# Patient Record
Sex: Male | Born: 1935 | Race: White | Hispanic: No | Smoking: Former smoker
Health system: Southern US, Community
[De-identification: ages and names within clinical notes are randomized; demographics above are authoritative.]

## PROBLEM LIST (undated history)

## (undated) DIAGNOSIS — Z950 Presence of cardiac pacemaker: Secondary | ICD-10-CM

## (undated) DIAGNOSIS — M1990 Unspecified osteoarthritis, unspecified site: Secondary | ICD-10-CM

## (undated) DIAGNOSIS — D649 Anemia, unspecified: Secondary | ICD-10-CM

## (undated) DIAGNOSIS — I2510 Atherosclerotic heart disease of native coronary artery without angina pectoris: Secondary | ICD-10-CM

## (undated) DIAGNOSIS — I219 Acute myocardial infarction, unspecified: Secondary | ICD-10-CM

## (undated) DIAGNOSIS — R0602 Shortness of breath: Secondary | ICD-10-CM

## (undated) DIAGNOSIS — Z95810 Presence of automatic (implantable) cardiac defibrillator: Secondary | ICD-10-CM

## (undated) DIAGNOSIS — I509 Heart failure, unspecified: Secondary | ICD-10-CM

## (undated) DIAGNOSIS — F419 Anxiety disorder, unspecified: Secondary | ICD-10-CM

## (undated) DIAGNOSIS — I739 Peripheral vascular disease, unspecified: Secondary | ICD-10-CM

## (undated) HISTORY — PX: SHOULDER SURGERY: SHX246

## (undated) HISTORY — PX: TOTAL HIP ARTHROPLASTY: SHX124

## (undated) HISTORY — PX: HERNIA REPAIR: SHX51

## (undated) HISTORY — PX: INSERT / REPLACE / REMOVE PACEMAKER: SUR710

---

## 2002-09-14 ENCOUNTER — Encounter: Admission: RE | Admit: 2002-09-14 | Discharge: 2002-09-14 | Payer: Self-pay | Admitting: Internal Medicine

## 2002-09-14 ENCOUNTER — Encounter: Payer: Self-pay | Admitting: Internal Medicine

## 2003-02-14 ENCOUNTER — Encounter: Payer: Self-pay | Admitting: Neurology

## 2003-02-14 ENCOUNTER — Encounter: Admission: RE | Admit: 2003-02-14 | Discharge: 2003-02-14 | Payer: Self-pay | Admitting: Neurology

## 2004-06-11 ENCOUNTER — Ambulatory Visit: Payer: Self-pay | Admitting: Internal Medicine

## 2004-07-27 HISTORY — PX: LEG AMPUTATION THROUGH KNEE: SHX696

## 2004-08-11 ENCOUNTER — Ambulatory Visit: Payer: Self-pay | Admitting: Internal Medicine

## 2005-05-13 ENCOUNTER — Ambulatory Visit: Payer: Self-pay | Admitting: Internal Medicine

## 2005-06-16 ENCOUNTER — Ambulatory Visit: Payer: Self-pay

## 2005-07-29 ENCOUNTER — Ambulatory Visit: Payer: Self-pay | Admitting: Internal Medicine

## 2005-08-11 ENCOUNTER — Ambulatory Visit: Payer: Self-pay | Admitting: Cardiology

## 2005-10-29 ENCOUNTER — Ambulatory Visit: Payer: Self-pay | Admitting: Internal Medicine

## 2006-02-02 ENCOUNTER — Ambulatory Visit: Payer: Self-pay | Admitting: Internal Medicine

## 2006-04-12 ENCOUNTER — Ambulatory Visit: Payer: Self-pay | Admitting: Internal Medicine

## 2006-05-20 ENCOUNTER — Ambulatory Visit: Payer: Self-pay | Admitting: Internal Medicine

## 2006-09-07 ENCOUNTER — Ambulatory Visit: Payer: Self-pay | Admitting: Internal Medicine

## 2006-09-07 LAB — CONVERTED CEMR LAB
ALT: 33 units/L (ref 0–40)
AST: 31 units/L (ref 0–37)
Albumin: 3.6 g/dL (ref 3.5–5.2)
Bilirubin, Direct: 0.2 mg/dL (ref 0.0–0.3)
Calcium: 9.6 mg/dL (ref 8.4–10.5)
Chloride: 97 meq/L (ref 96–112)
Creatinine, Ser: 0.6 mg/dL (ref 0.4–1.5)
Creatinine,U: 51 mg/dL
Eosinophils Relative: 1.8 % (ref 0.0–5.0)
Glucose, Bld: 125 mg/dL — ABNORMAL HIGH (ref 70–99)
HCT: 43 % (ref 39.0–52.0)
Neutrophils Relative %: 58 % (ref 43.0–77.0)
PSA: 1.4 ng/mL (ref 0.10–4.00)
RBC: 4.72 M/uL (ref 4.22–5.81)
RDW: 12.5 % (ref 11.5–14.6)
Sodium: 138 meq/L (ref 135–145)
TSH: 2.46 microintl units/mL (ref 0.35–5.50)
Total Bilirubin: 0.9 mg/dL (ref 0.3–1.2)
Total CHOL/HDL Ratio: 4.8
Triglycerides: 307 mg/dL (ref 0–149)
VLDL: 61 mg/dL — ABNORMAL HIGH (ref 0–40)
WBC: 6.4 10*3/uL (ref 4.5–10.5)

## 2006-09-13 ENCOUNTER — Encounter: Payer: Self-pay | Admitting: Internal Medicine

## 2006-09-13 LAB — CONVERTED CEMR LAB: PSA: 1.4 ng/mL

## 2006-12-13 ENCOUNTER — Ambulatory Visit: Payer: Self-pay | Admitting: Internal Medicine

## 2007-02-26 ENCOUNTER — Encounter: Payer: Self-pay | Admitting: Internal Medicine

## 2007-02-26 DIAGNOSIS — N400 Enlarged prostate without lower urinary tract symptoms: Secondary | ICD-10-CM | POA: Insufficient documentation

## 2007-02-26 DIAGNOSIS — F329 Major depressive disorder, single episode, unspecified: Secondary | ICD-10-CM | POA: Insufficient documentation

## 2007-02-26 DIAGNOSIS — K219 Gastro-esophageal reflux disease without esophagitis: Secondary | ICD-10-CM

## 2007-02-26 DIAGNOSIS — I10 Essential (primary) hypertension: Secondary | ICD-10-CM | POA: Insufficient documentation

## 2007-02-26 DIAGNOSIS — E119 Type 2 diabetes mellitus without complications: Secondary | ICD-10-CM

## 2007-02-26 DIAGNOSIS — R160 Hepatomegaly, not elsewhere classified: Secondary | ICD-10-CM

## 2007-02-26 DIAGNOSIS — F411 Generalized anxiety disorder: Secondary | ICD-10-CM | POA: Insufficient documentation

## 2007-02-26 DIAGNOSIS — E785 Hyperlipidemia, unspecified: Secondary | ICD-10-CM | POA: Insufficient documentation

## 2007-02-26 DIAGNOSIS — E669 Obesity, unspecified: Secondary | ICD-10-CM | POA: Insufficient documentation

## 2007-03-15 ENCOUNTER — Ambulatory Visit: Payer: Self-pay | Admitting: Internal Medicine

## 2007-03-15 LAB — CONVERTED CEMR LAB
BUN: 15 mg/dL (ref 6–23)
CO2: 30 meq/L (ref 19–32)
Calcium: 9.4 mg/dL (ref 8.4–10.5)
Cholesterol: 169 mg/dL (ref 0–200)
Creatinine, Ser: 0.6 mg/dL (ref 0.4–1.5)
GFR calc Af Amer: 168 mL/min
Glucose, Bld: 133 mg/dL — ABNORMAL HIGH (ref 70–99)
Total CHOL/HDL Ratio: 4.6

## 2007-06-13 ENCOUNTER — Telehealth: Payer: Self-pay | Admitting: Internal Medicine

## 2007-06-14 ENCOUNTER — Ambulatory Visit: Payer: Self-pay | Admitting: Internal Medicine

## 2007-06-14 DIAGNOSIS — I739 Peripheral vascular disease, unspecified: Secondary | ICD-10-CM

## 2007-06-14 DIAGNOSIS — N318 Other neuromuscular dysfunction of bladder: Secondary | ICD-10-CM | POA: Insufficient documentation

## 2007-06-14 DIAGNOSIS — K5730 Diverticulosis of large intestine without perforation or abscess without bleeding: Secondary | ICD-10-CM | POA: Insufficient documentation

## 2007-06-14 DIAGNOSIS — Z87442 Personal history of urinary calculi: Secondary | ICD-10-CM | POA: Insufficient documentation

## 2007-06-14 LAB — CONVERTED CEMR LAB
Chloride: 103 meq/L (ref 96–112)
Cholesterol: 147 mg/dL (ref 0–200)
GFR calc Af Amer: 140 mL/min
GFR calc non Af Amer: 116 mL/min
Glucose, Bld: 66 mg/dL — ABNORMAL LOW (ref 70–99)
HDL: 46 mg/dL (ref >39.0)
Hgb A1c MFr Bld: 6.5 % — ABNORMAL HIGH (ref 4.6–6.0)
LDL Cholesterol: 67 mg/dL (ref 0–99)
Potassium: 5.2 meq/L — ABNORMAL HIGH (ref 3.5–5.1)
Sodium: 138 meq/L (ref 135–145)
Total CHOL/HDL Ratio: 3.2
Triglycerides: 172 mg/dL — ABNORMAL HIGH (ref 0–149)
VLDL: 34 mg/dL (ref 0–40)

## 2007-06-20 ENCOUNTER — Telehealth (INDEPENDENT_AMBULATORY_CARE_PROVIDER_SITE_OTHER): Payer: Self-pay | Admitting: *Deleted

## 2007-08-10 ENCOUNTER — Encounter: Payer: Self-pay | Admitting: Internal Medicine

## 2007-09-13 ENCOUNTER — Ambulatory Visit: Payer: Self-pay | Admitting: Internal Medicine

## 2007-09-13 DIAGNOSIS — R5381 Other malaise: Secondary | ICD-10-CM | POA: Insufficient documentation

## 2007-09-13 DIAGNOSIS — R5383 Other fatigue: Secondary | ICD-10-CM

## 2007-09-14 LAB — CONVERTED CEMR LAB
ALT: 14 units/L (ref 0–53)
Alkaline Phosphatase: 69 units/L (ref 39–117)
Basophils Relative: 1.2 % — ABNORMAL HIGH (ref 0.0–1.0)
Bilirubin, Direct: 0.1 mg/dL (ref 0.0–0.3)
Eosinophils Absolute: 0.2 10*3/uL (ref 0.0–0.6)
Eosinophils Relative: 3 % (ref 0.0–5.0)
HCT: 36.1 % — ABNORMAL LOW (ref 39.0–52.0)
Lymphocytes Relative: 34.2 % (ref 12.0–46.0)
MCV: 89.3 fL (ref 78.0–100.0)
Microalb Creat Ratio: 89.5 mg/g — ABNORMAL HIGH (ref 0.0–30.0)
Microalb, Ur: 7 mg/dL — ABNORMAL HIGH (ref 0.0–1.9)
Neutro Abs: 2.8 10*3/uL (ref 1.4–7.7)
Neutrophils Relative %: 49 % (ref 43.0–77.0)
Platelets: 182 10*3/uL (ref 150–400)
TSH: 2.23 microintl units/mL (ref 0.35–5.50)
Total Bilirubin: 0.8 mg/dL (ref 0.3–1.2)
Total Protein: 6.9 g/dL (ref 6.0–8.3)
Triglycerides: 167 mg/dL — ABNORMAL HIGH (ref 0–149)
VLDL: 33 mg/dL (ref 0–40)
WBC: 6 10*3/uL (ref 4.5–10.5)

## 2007-11-02 ENCOUNTER — Ambulatory Visit: Payer: Self-pay | Admitting: Internal Medicine

## 2007-11-02 DIAGNOSIS — L02619 Cutaneous abscess of unspecified foot: Secondary | ICD-10-CM

## 2007-11-02 DIAGNOSIS — L03119 Cellulitis of unspecified part of limb: Secondary | ICD-10-CM

## 2007-11-02 DIAGNOSIS — E1169 Type 2 diabetes mellitus with other specified complication: Secondary | ICD-10-CM | POA: Insufficient documentation

## 2008-01-10 ENCOUNTER — Encounter: Payer: Self-pay | Admitting: Emergency Medicine

## 2008-01-11 ENCOUNTER — Inpatient Hospital Stay (HOSPITAL_COMMUNITY): Admission: EM | Admit: 2008-01-11 | Discharge: 2008-01-16 | Payer: Self-pay | Admitting: Internal Medicine

## 2008-01-11 ENCOUNTER — Ambulatory Visit: Payer: Self-pay | Admitting: Internal Medicine

## 2008-01-13 ENCOUNTER — Encounter (INDEPENDENT_AMBULATORY_CARE_PROVIDER_SITE_OTHER): Payer: Self-pay | Admitting: Orthopedic Surgery

## 2008-01-29 ENCOUNTER — Encounter: Payer: Self-pay | Admitting: Orthopedic Surgery

## 2008-01-29 ENCOUNTER — Inpatient Hospital Stay (HOSPITAL_COMMUNITY): Admission: EM | Admit: 2008-01-29 | Discharge: 2008-02-03 | Payer: Self-pay | Admitting: Orthopedic Surgery

## 2008-01-29 ENCOUNTER — Encounter (INDEPENDENT_AMBULATORY_CARE_PROVIDER_SITE_OTHER): Payer: Self-pay | Admitting: Orthopedic Surgery

## 2008-02-09 ENCOUNTER — Ambulatory Visit: Payer: Self-pay | Admitting: Vascular Surgery

## 2008-02-09 ENCOUNTER — Inpatient Hospital Stay (HOSPITAL_COMMUNITY): Admission: EM | Admit: 2008-02-09 | Discharge: 2008-02-15 | Payer: Self-pay | Admitting: Emergency Medicine

## 2008-02-09 ENCOUNTER — Ambulatory Visit: Payer: Self-pay | Admitting: Internal Medicine

## 2008-02-10 ENCOUNTER — Encounter: Payer: Self-pay | Admitting: Internal Medicine

## 2008-03-13 ENCOUNTER — Ambulatory Visit: Payer: Self-pay | Admitting: Internal Medicine

## 2008-03-13 LAB — CONVERTED CEMR LAB
BUN: 27 mg/dL — ABNORMAL HIGH (ref 6–23)
CO2: 28 meq/L (ref 19–32)
Calcium: 9.8 mg/dL (ref 8.4–10.5)
Chloride: 101 meq/L (ref 96–112)
Cholesterol: 245 mg/dL (ref 0–200)
Creatinine, Ser: 0.7 mg/dL (ref 0.4–1.5)
Direct LDL: 160.2 mg/dL
GFR calc Af Amer: 140 mL/min
GFR calc non Af Amer: 116 mL/min
Glucose, Bld: 89 mg/dL (ref 70–99)
HDL: 36.8 mg/dL — ABNORMAL LOW (ref >39.0)
Hgb A1c MFr Bld: 6.4 % — ABNORMAL HIGH (ref 4.6–6.0)
Potassium: 4.6 meq/L (ref 3.5–5.1)
Sodium: 137 meq/L (ref 135–145)
Total CHOL/HDL Ratio: 6.7
Triglycerides: 274 mg/dL (ref 0–149)
VLDL: 55 mg/dL — ABNORMAL HIGH (ref 0–40)

## 2008-03-15 ENCOUNTER — Encounter (HOSPITAL_BASED_OUTPATIENT_CLINIC_OR_DEPARTMENT_OTHER): Admission: RE | Admit: 2008-03-15 | Discharge: 2008-06-13 | Payer: Self-pay | Admitting: Internal Medicine

## 2008-04-25 ENCOUNTER — Inpatient Hospital Stay (HOSPITAL_COMMUNITY): Admission: RE | Admit: 2008-04-25 | Discharge: 2008-04-27 | Payer: Self-pay | Admitting: Orthopedic Surgery

## 2008-04-25 ENCOUNTER — Encounter (INDEPENDENT_AMBULATORY_CARE_PROVIDER_SITE_OTHER): Payer: Self-pay | Admitting: Orthopedic Surgery

## 2008-05-01 ENCOUNTER — Ambulatory Visit: Payer: Self-pay | Admitting: Vascular Surgery

## 2008-08-21 IMAGING — CR DG CHEST 1V
1 series · 1 of 1 positions shown · non-contrast
Comparison: 02/08/2008

CLINICAL DATA: Altered mental status

CHEST - 1 VIEW

[view not recorded]
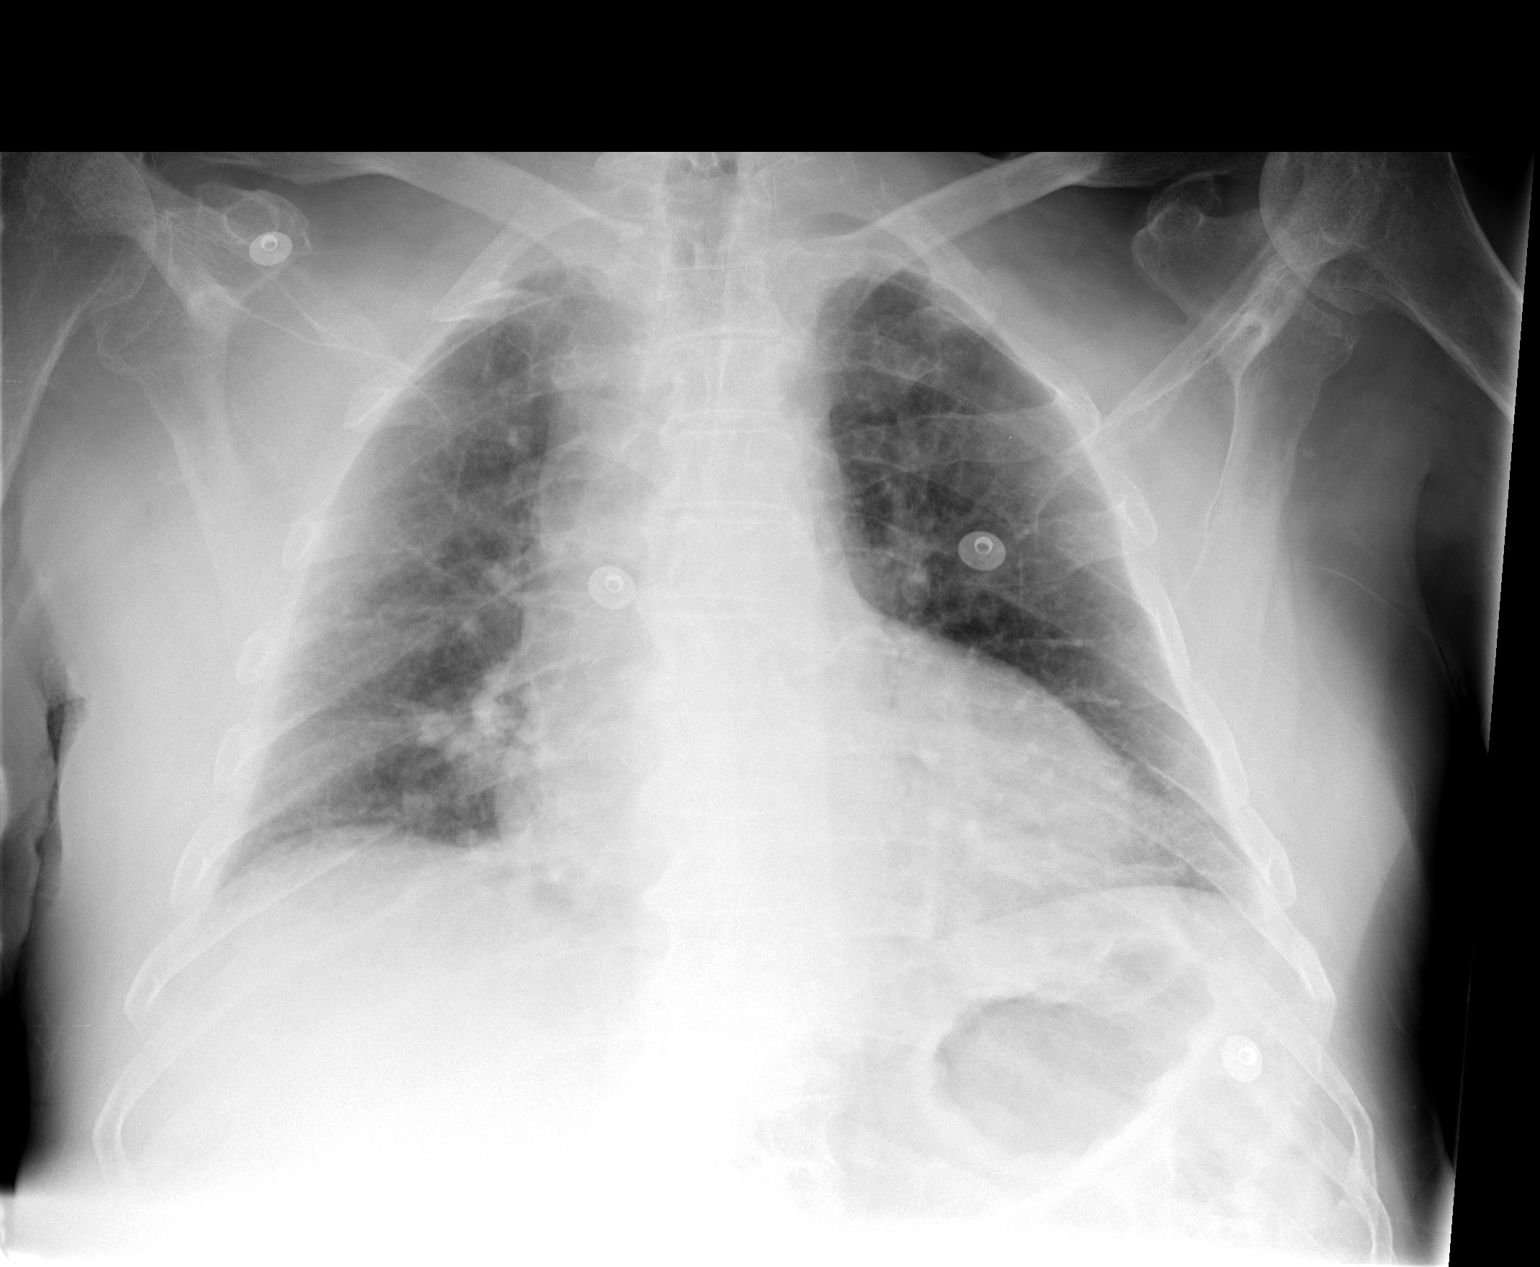

[1 of 1 positions shown; findings below may reference images not displayed]

FINDINGS: Lung volumes are significantly lower than on the prior
study with crowding of bronchovascular markings.  No new focal
finding.  Cardiomegaly, no edema.  High-riding bilateral humeral
heads may suggest rotator cuff pathology versus positioning.
IMPRESSION: Extremely low volumes, no new focal finding. If the patient's
symptoms continue, consider PA and lateral chest radiographs
obtained at full inspiration when the patient is clinically able.

Cardiomegaly, no edema.

## 2009-01-02 ENCOUNTER — Ambulatory Visit: Payer: Self-pay | Admitting: Thoracic Surgery

## 2009-01-10 ENCOUNTER — Ambulatory Visit (HOSPITAL_COMMUNITY): Admission: RE | Admit: 2009-01-10 | Discharge: 2009-01-10 | Payer: Self-pay | Admitting: Thoracic Surgery

## 2009-01-10 ENCOUNTER — Ambulatory Visit: Payer: Self-pay | Admitting: Thoracic Surgery

## 2009-01-10 ENCOUNTER — Encounter: Payer: Self-pay | Admitting: Thoracic Surgery

## 2009-01-16 ENCOUNTER — Ambulatory Visit: Payer: Self-pay | Admitting: Thoracic Surgery

## 2009-01-18 ENCOUNTER — Encounter: Payer: Self-pay | Admitting: Thoracic Surgery

## 2009-01-18 ENCOUNTER — Ambulatory Visit (HOSPITAL_COMMUNITY): Admission: RE | Admit: 2009-01-18 | Discharge: 2009-01-18 | Payer: Self-pay | Admitting: Thoracic Surgery

## 2009-01-23 ENCOUNTER — Ambulatory Visit: Payer: Self-pay | Admitting: Thoracic Surgery

## 2009-01-23 ENCOUNTER — Encounter: Admission: RE | Admit: 2009-01-23 | Discharge: 2009-01-23 | Payer: Self-pay | Admitting: Thoracic Surgery

## 2009-02-20 ENCOUNTER — Ambulatory Visit: Payer: Self-pay | Admitting: Thoracic Surgery

## 2009-02-20 ENCOUNTER — Encounter: Admission: RE | Admit: 2009-02-20 | Discharge: 2009-02-20 | Payer: Self-pay | Admitting: Thoracic Surgery

## 2009-05-07 ENCOUNTER — Encounter: Admission: RE | Admit: 2009-05-07 | Discharge: 2009-05-07 | Payer: Self-pay | Admitting: Orthopedic Surgery

## 2009-07-22 IMAGING — CR DG CHEST 2V
2 series · 2 of 2 positions shown · non-contrast
Comparison: 02/10/2008.

CLINICAL DATA: 82-year-old male pre bronchoscopy.

CHEST - 2 VIEW

[view not recorded (1 of 2)]
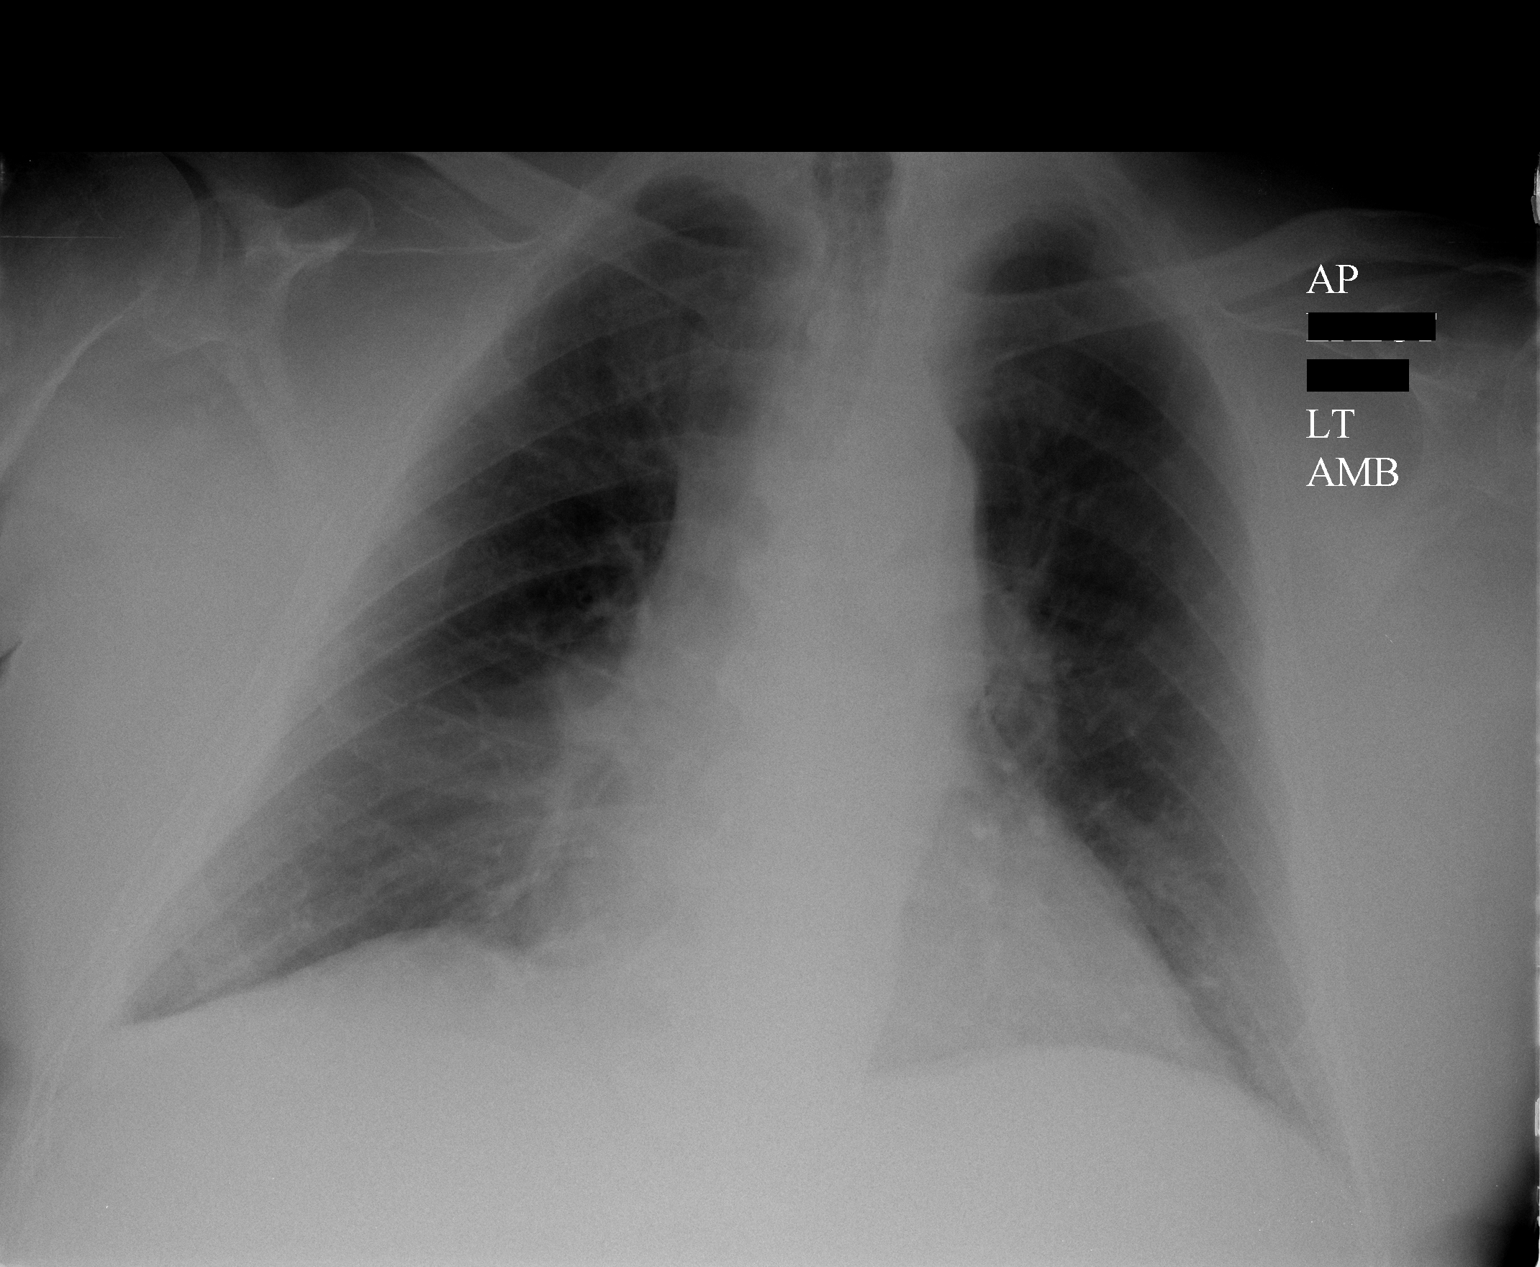

[view not recorded (2 of 2)]
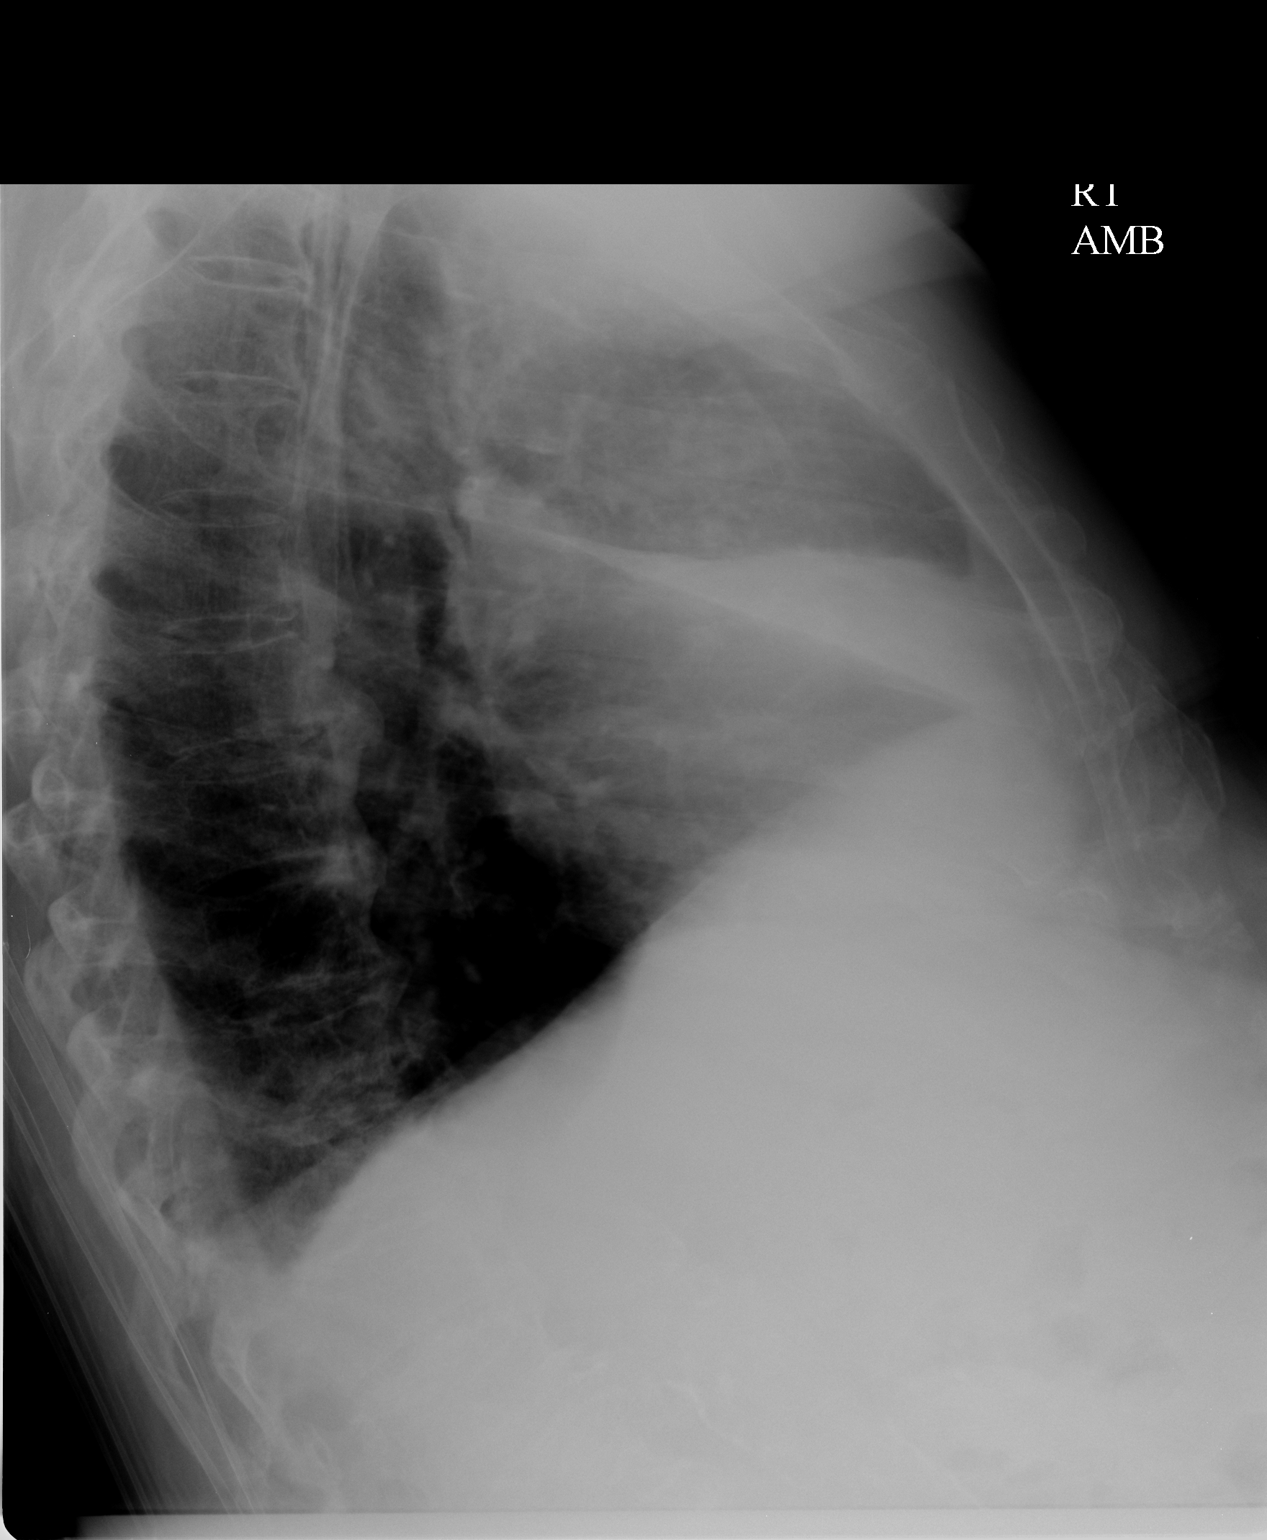

[2 of 2 positions shown; findings below may reference images not displayed]

FINDINGS: AP upright view at 8299 hours.  Normal to mildly
hyperinflated lungs.  Indistinct right infrahilar opacity.  Cardiac
size and mediastinal contours are within normal limits.  No
pneumothorax, pulmonary edema, pleural effusion, or consolidation.
IMPRESSION: 1.  Vague right infrahilar opacity may be artifact on this portable
view. Attention directed on follow-up.
2.  Otherwise, no acute cardiopulmonary abnormality.

## 2009-07-24 ENCOUNTER — Ambulatory Visit (HOSPITAL_COMMUNITY): Admission: RE | Admit: 2009-07-24 | Discharge: 2009-07-25 | Payer: Self-pay | Admitting: Orthopedic Surgery

## 2009-09-01 IMAGING — CR DG CHEST 2V
2 series · 2 of 2 positions shown · non-contrast
Comparison: 01/23/2009

CLINICAL DATA: Aspiration history.

CHEST - 2 VIEW

[view not recorded (1 of 2)]
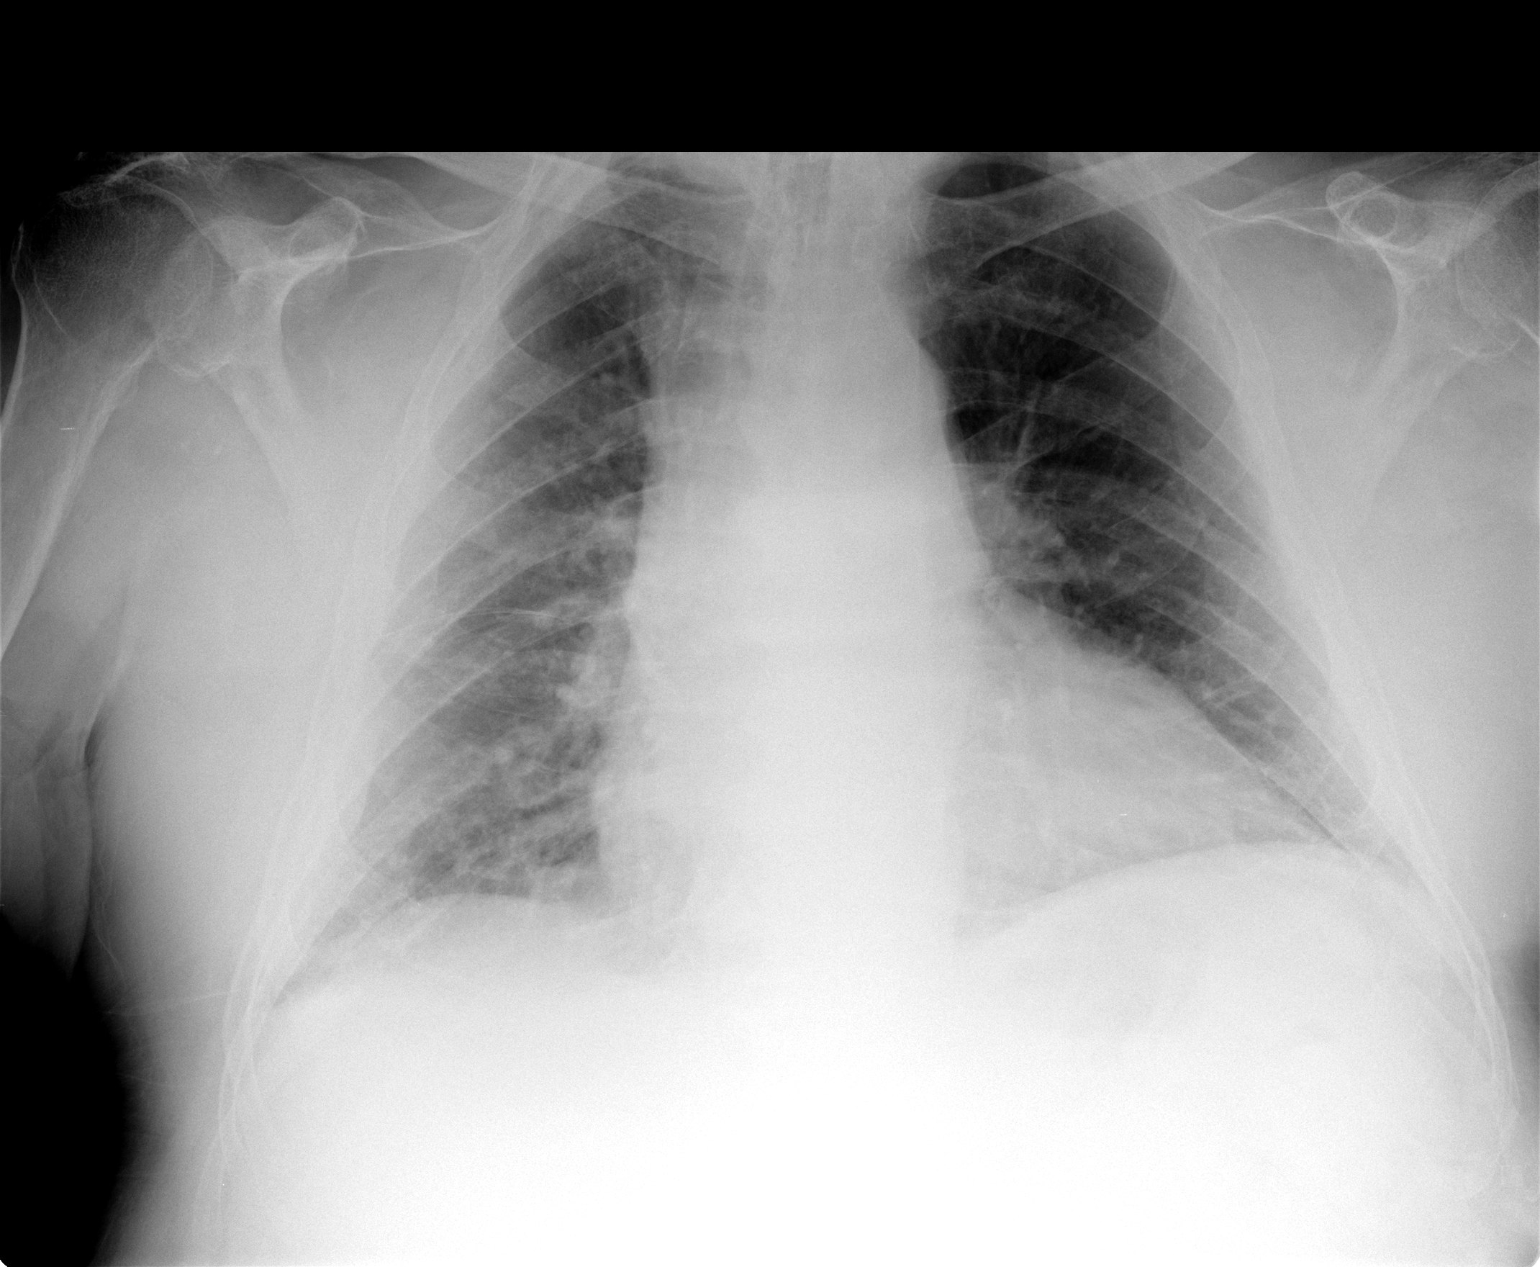

[view not recorded (2 of 2)]
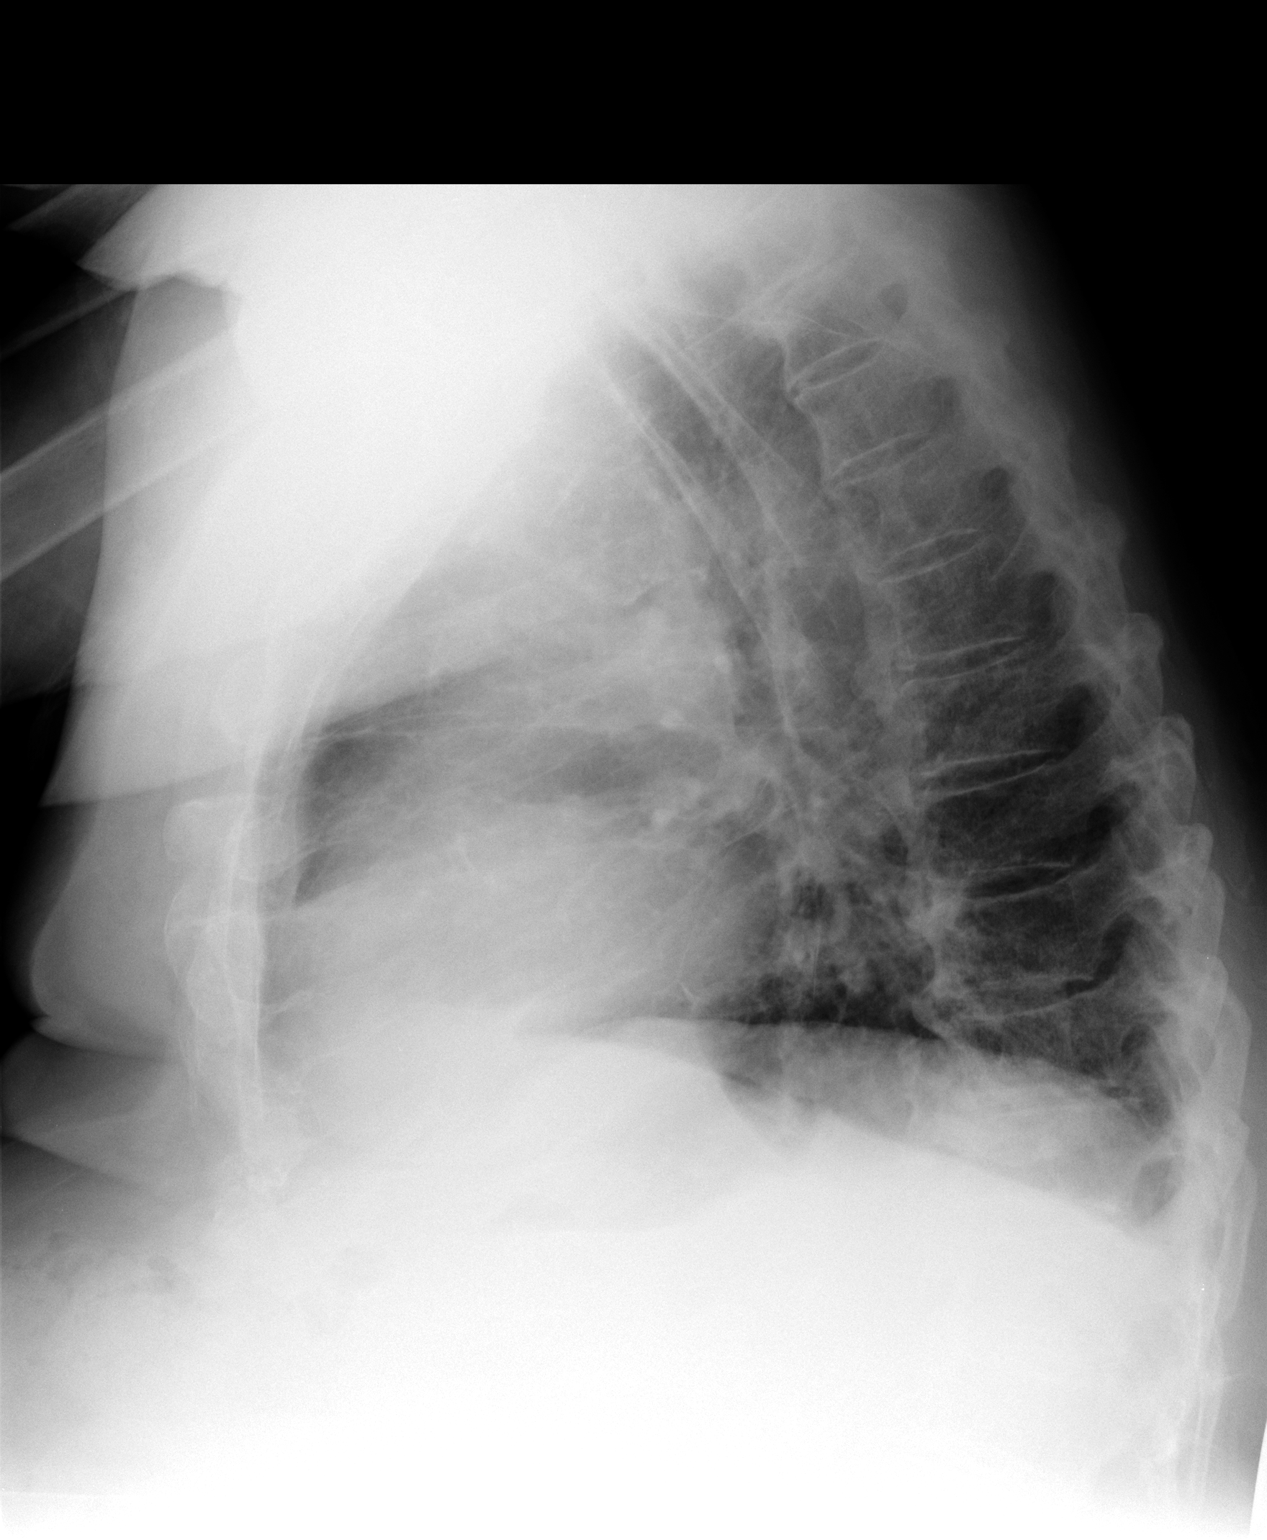

[2 of 2 positions shown; findings below may reference images not displayed]

FINDINGS: There is cardiomegaly with mild vascular congestion.
Prominent markings in the right lung have improved somewhat since
prior study.  Left lung remains clear.  No effusions.  Degenerative
changes throughout the thoracic spine.
IMPRESSION: Cardiomegaly with vascular congestion.

Improving increased markings in the right lung.

## 2010-10-27 LAB — GLUCOSE, CAPILLARY
Glucose-Capillary: 121 mg/dL — ABNORMAL HIGH (ref 70–99)
Glucose-Capillary: 133 mg/dL — ABNORMAL HIGH (ref 70–99)

## 2010-11-03 LAB — COMPREHENSIVE METABOLIC PANEL WITH GFR
ALT: 15 U/L (ref 0–53)
AST: 21 U/L (ref 0–37)
Albumin: 3.4 g/dL — ABNORMAL LOW (ref 3.5–5.2)
Alkaline Phosphatase: 83 U/L (ref 39–117)
BUN: 16 mg/dL (ref 6–23)
CO2: 27 meq/L (ref 19–32)
Calcium: 9.4 mg/dL (ref 8.4–10.5)
Chloride: 99 meq/L (ref 96–112)
Creatinine, Ser: 0.46 mg/dL (ref 0.4–1.5)
GFR calc non Af Amer: >60 mL/min
Glucose, Bld: 161 mg/dL — ABNORMAL HIGH (ref 70–99)
Potassium: 4.4 meq/L (ref 3.5–5.1)
Sodium: 135 meq/L (ref 135–145)
Total Bilirubin: 0.6 mg/dL (ref 0.3–1.2)
Total Protein: 8.1 g/dL (ref 6.0–8.3)

## 2010-11-03 LAB — COMPREHENSIVE METABOLIC PANEL
ALT: 16 U/L (ref 0–53)
AST: 15 U/L (ref 0–37)
Alkaline Phosphatase: 81 U/L (ref 39–117)
CO2: 28 mEq/L (ref 19–32)
Calcium: 9 mg/dL (ref 8.4–10.5)
Chloride: 96 mEq/L (ref 96–112)
GFR calc Af Amer: >60 mL/min (ref >60)
GFR calc non Af Amer: >60 mL/min (ref >60)
Glucose, Bld: 182 mg/dL — ABNORMAL HIGH (ref 70–99)
Sodium: 132 mEq/L — ABNORMAL LOW (ref 135–145)
Total Bilirubin: 0.5 mg/dL (ref 0.3–1.2)

## 2010-11-03 LAB — CBC
HCT: 40.7 % (ref 39.0–52.0)
Hemoglobin: 11.8 g/dL — ABNORMAL LOW (ref 13.0–17.0)
Hemoglobin: 13.5 g/dL (ref 13.0–17.0)
MCHC: 32.5 g/dL (ref 30.0–36.0)
MCHC: 33.2 g/dL (ref 30.0–36.0)
MCV: 85.9 fL (ref 78.0–100.0)
Platelets: 231 10*3/uL (ref 150–400)
RBC: 4.24 MIL/uL (ref 4.22–5.81)
RBC: 4.74 MIL/uL (ref 4.22–5.81)
RDW: 14.4 % (ref 11.5–15.5)
WBC: 7.6 10*3/uL (ref 4.0–10.5)
WBC: 7.6 10*3/uL (ref 4.0–10.5)

## 2010-11-03 LAB — CULTURE, RESPIRATORY W GRAM STAIN

## 2010-11-03 LAB — PROTIME-INR
INR: 0.9 (ref 0.00–1.49)
Prothrombin Time: 12.6 s (ref 11.6–15.2)
Prothrombin Time: 13.6 seconds (ref 11.6–15.2)

## 2010-11-03 LAB — APTT: aPTT: 33 seconds (ref 24–37)

## 2010-11-03 LAB — GLUCOSE, CAPILLARY
Glucose-Capillary: 155 mg/dL — ABNORMAL HIGH (ref 70–99)
Glucose-Capillary: 175 mg/dL — ABNORMAL HIGH (ref 70–99)

## 2010-12-09 NOTE — Discharge Summary (Signed)
NAMEJERAD, Ivan [name]                  ACCOUNT NO.:  [id number]   MEDICAL RECORD NO.:  [id number]          PATIENT TYPE:  INP   LOCATION:  1540                         FACILITY:  [facility]   PHYSICIAN:  Rosalyn Gess. Norins, MD  DATE OF BIRTH:  February 18, 1927   DATE OF ADMISSION:  01/11/2008  DATE OF DISCHARGE:                               DISCHARGE SUMMARY   EXPECTED DATE OF DISCHARGE:  January 16, 2008   DISCHARGE DIAGNOSES:  1. Accidental fall with subsequent right femur fracture status post      open reduction internal fixation on January 13, 2008.  2. Osteomyelitis of right great toe status post amputation on January 13, 2008.  3. Postoperative anemia.  4. Roof right leg skin break down.  5. Type 2 diabetes.   HISTORY OF PRESENT ILLNESS:  Ivan [name] is a 77 year old white male with  multiple medical problems including type 2 diabetes, hypertension,  hyperlipidemia and chronic pain.  The patient presented to the emergency  room on day of admission after falling at home, stating he lost his  balance while maneuvering with walker and fell.  The patient was unable  to get up from floor after fall and yelled out for his neighbor, who  responded and called EMS.  Also of note, the patient reported 1 week  prior to this admission he had infection of his right great toe which he  tried treating at home with peroxide and stated that his toe became  black and fell off.  The patient denied any fevers, chills, nausea,  vomiting or shortness of breath.  The patient denied any syncopal or  presyncopal symptoms.  No abdominal pain, no melena, no hematemesis or  hematochezia.  Upon evaluation in the ER, the patient found to have  right mid femoral shaft fracture, also with cellulitis of the right  foot.  The patient was admitted at that time for further evaluation and  treatment.  Orthopedics evaluated the patient at the time of admission.   PAST MEDICAL HISTORY:  1. Hypertension.  2. Hyperlipidemia.  3.  Type 2 diabetes.  4. Chronic low-back pain.  5. Status post right hip arthroplasty.   COURSE OF HOSPITALIZATION:  1. Accidental fall with subsequent right femoral fracture.  As      mentioned above, the patient seen in consultation by orthopedics at      time of admission.  The patient did undergo ORIF of right hip on      January 13, 2008, per Dr. Darrelyn Hillock.  The patient has been placed on DVT      prophylaxis with Coumadin at this time.  We will resume Coumadin at      time of discharge, length to be determined by orthopedics.  If      patient able to tolerate anticoagulation well, expected 3-6 months      treatment with anticoagulation.  Due to chronic debilitation and      with new fracture, the patient to be discharged from hospital to      skilled nursing facility for  continued physical and occupational      therapy.  The patient's postoperative period complicated slightly      by anemia.  The patient transfused 2 units of packed red blood      cells on January 15, 2008.  No acute blood loss on exam at this time.      Hemoglobin 8.3 prior to transfusion, up to 9.6 at time of      discharge.  2. Right great toe osteomyelitis status post amputation on June 19.      As mentioned in HPI, the patient attempted to self-treat gangrenous      toe prior to this admission and orthopedics felt it best to      amputate toe during procedure for #1.  The patient has remained      afebrile with normal white cell count throughout hospitalization.      However, he has been placed empirically on antibiotics as tissue      culture positive for Morganella morganii sensitive to      ciprofloxacin.  The patient has completed a total of 6 days      imipenem throughout hospitalization.  We will continue p.o. Cipro      at time of discharge for a total of 14 days treatment.  In      addition, the patient to be seen by wound care nurse prior to      discharge to determine the most effective wound care treatment  at      time of discharge.  3. Right leg skin break down secondary to knee immobilizer.  The      patient's knee immobilizer has been discontinued per orthopedics,      as it appeared to be rubbing blisters on the patient's skin.      Again, will have wound care nurse see this patient in order to      optimize wound care treatment prior to discharge.   MEDICATIONS AT TIME OF DISCHARGE:  1. Coumadin 5 mg p.o. daily unless otherwise directed, length to be      determined by orthopedics.  2. Pravachol 40 mg p.o. q.h.s.  3. Colace 100 mg p.o. b.i.d.  4. Ferrous sulfate 325 mg p.o. t.i.d.  5. Benazepril 40 mg p.o. daily.  6. Librium 10 mg p.o. daily.  7. Actoplus 15/850 one tablet p.o. daily.  8. Ciprofloxacin 500 mg p.o. daily until June 30.  9. Percocet 10/650 one tablet p.o. q.6 h. p.r.n. pain.   PERTINENT LABORATORY WORK AT TIME OF DISCHARGE:  White cell count 7.9,  platelet count 207, hemoglobin 9.6, hematocrit 28.4.  Sodium 134,  potassium 3.9, BUN 6, creatinine 0.66.  INR 1.9.  Blood cultures x2  obtained on June 16 were negative for any growth.  Hemoglobin A1c 6.0.  Total cholesterol 147, HDL 43, LDL 80, triglycerides 118.  Tissue  culture obtained on June 19 positive for Morganella morganii sensitive  to ciprofloxacin.   PHYSICAL EXAMINATION AT TIME OF DISCHARGE:  Blood pressure 104/54, heart  rate 84, respirations 18, temperature 97.2, O2 saturation is 90% on room  air.  GENERAL:  This is an elderly white male, awake and alert in no acute  distress.  HEAD, EYES, EARS, NOSE AND THROAT:  The patient normocephalic,  atraumatic.  Pupils equal, round, reactive to light.  Extraocular  movements intact.  Oropharynx was clear with dentures.  No lesions or  exudate.  NECK:  Supple with no thyromegaly or lymphadenopathy.  RESPIRATORY:  Lung sounds clear to auscultation bilaterally.  No  wheezes, rales or crackles.  No increased work of breathing.  CARDIOVASCULAR:  S1, S2.  Regular  rate and rhythm.  No murmurs, rubs or  gallops.  ABDOMEN:  Soft, nontender, nondistended with positive bowel sounds.  No  HSM.  EXTREMITIES:  No clubbing or cyanosis.  Dressing to right foot and right  hip clean, dry and intact at time of exam.  The patient with moderate  skin breakdown distally to right thigh with approximately 2 x 2 cm open  sore, apparently caused by knee immobilizer NEUROLOGICALLY:  The patient  is alert and oriented x3 with no focal motor or sensory deficits on  exam.   DISPOSITION:  The patient felt medically stable by both orthopedics and  internal medicine for discharge to skilled nursing facility.  Prior to  disposition, we will have the patient seen by wound care RN in order to  optimize wound care treatment after discharge.  Please call Dr.  Jeannetta Ellis office at [phone] to schedule follow-up appointment 1-2 weeks  post discharge.      Cordelia Pen, NP      Rosalyn Gess. Norins, MD  Electronically Signed    LE/MEDQ  D:  01/16/2008  T:  01/16/2008  Job:  [id number]   cc:   Corwin Levins, MD  [address]  [place]  [place] [address]   Georges Lynch. Darrelyn Hillock, M.D.  Fax: [phone]

## 2010-12-09 NOTE — Op Note (Signed)
[name], Ivan [name] NO.:  [id number]   MEDICAL RECORD NO.:  [id number]          PATIENT TYPE:  OIB   LOCATION:                               FACILITY:  [facility]   PHYSICIAN:  Georges Lynch. Gioffre, M.D.DATE OF BIRTH:  14-Apr-1927   DATE OF PROCEDURE:  04/24/2008  DATE OF DISCHARGE:                               OPERATIVE REPORT   SURGEON:  Georges Lynch. Darrelyn Hillock, M.D.   ASSISTANT:  Jamelle Rushing, P.A.   PREOPERATIVE DIAGNOSIS:  Necrotic below the knee amputation stump.   POSTOPERATIVE DIAGNOSIS:  Necrotic below the knee amputation stump.   OPERATION:  Revision of a necrotic below the knee amputation stump to an  above the knee amputation.   PROCEDURE IN DETAIL:  Under general anesthesia routine prep and draping  of the right lower leg was carried out.  Note the distal part of its  amputation stump was very necrotic and there was a malodor from it.  I  elected at this time to convert this to an above the knee amputation.  Now we had limited in regard to how high we could go as he has a  prosthesis in place plus a long plate that we plated for a previous  periprosthetic fracture.  The incision routine fishmouth type incision  was made.  The bleeders were cauterized.  I identified the popliteal  vessels and sutured those with suture ligatures.  The muscle that was  present proximally was intact and had good bleeding muscle.  At first  went up and did a knee disarticulation and then I extended the soft  tissue dissection proximally and removed the distal femur at just above  the supracondylar region.  I thoroughly irrigated out the area and  reapproximated the flap and closed the skin with metal staples and  immediately applied the wound VAC.  The patient had 1 gram of vancomycin  IV preop.           ______________________________  Georges Lynch. Darrelyn Hillock, M.D.     RAG/MEDQ  D:  04/25/2008  T:  04/25/2008  Job:  [id number]

## 2010-12-09 NOTE — Op Note (Signed)
NAMELENNELL, SHANKS                  ACCOUNT NO.:  [id number]   MEDICAL RECORD NO.:  [id number]          PATIENT TYPE:  INP   LOCATION:  1540                         FACILITY:  [facility]   PHYSICIAN:  Georges Lynch. Gioffre, M.D.DATE OF BIRTH:  06-07-1927   DATE OF PROCEDURE:  01/13/2008  DATE OF DISCHARGE:                               OPERATIVE REPORT   SURGEON:  Georges Lynch. Darrelyn Hillock, M.D.   ASSISTANT:  Jamelle Rushing, P.A.   PREOPERATIVE DIAGNOSES:  1. Osteomyelitis of the right great toe.  2. Severe displaced periprosthetic fracture of the right femur.   POSTOPERATIVE DIAGNOSES:  1. Osteomyelitis of the right great toe.  2. Severe displaced periprosthetic fracture of the right femur.   OPERATION:  1. Open reduction and internal fixation of a periprosthetic fracture      of the right femur utilizing the Zimmer plate with multiple screws      and 6 compression wires.  2. Amputation of the end of the right great toe.   PROCEDURE IN DETAIL:  Under general anesthesia, routine orthopedic prep  and draping the entire right lower extremity was carried out.  He had  his antibiotics that were ordered previously which consisted of  vancomycin and Primaxin.  He had a definite osteomyelitis of his right  great toe where a portion of his toe literally came off before he came  into the emergency room.  He had, as I mentioned, open reduction and  internal fixation of his right femur.   Under general anesthesia, routine orthopedic prep and draping of the  entire right lower extremity was carried out.  First an incision was  made over the lateral aspect of the right femur as the patient on his  left side with right side up.  Bleeders identified and cauterized.  I  then went down and opened the vastus lateralis down to the fracture  site.  I identified the fracture site and cauterized the various  bleeders.  Note, several of the vessels in that area were calcified so I  had to utilize a stick tie on  one occasion to stop the bleeding.  We did  have good control of the bleeding at all times.  I then utilized a  Jackson clamp to grasp the distal femoral fracture site, brought that up  and reduced it and held it in place.  I initially was going to take out  the cement plug but it was extremely tight in the canal and it helped me  in regards to holding the reduction.  Once the reduction was carried out  we held it in place with a [place] foot clamp.  We then selected a 10  hole Zimmer plate.  We brought the C-arm in to make sure that the plate  was the appropriate length.  The plate measured 161 mm.  It was a 10  hole plate.  We utilized six cable ready wires 1.8 mm in diameter.  Then  we utilized several screws for the plate fixation.  Once we had the  plate attached and the C-arm verified  good position we then affixed the  plate to the reduced femoral fracture site.  Three wires were placed  proximally and three distally.  They were locked into the plate with the  locking system.  We had an excellent reduction and an excellent  compression.  Following that we went down and inserted several screws  through the plate to help purchase the plate to the femur proximally and  distally.  We were able to go proximally as far as the distal part of  the prosthesis.  We had an anatomical reduction.  We had good stability  of the fracture.  C-arm visualized good position.  We thoroughly  irrigated out the area and then loosely applied some FloSeal, 20 mL of  FloSeal for hemostasis purposes.  We then closed the wound layers in the  usual fashion.  I left the deep vastus lateralis fascia open and this  closed the iliotibial band and the subcu and then the skin with metal  staples.  Following that we dressed the wound and then went down and  directed attention to the great toe.  Second part of the procedure was I  created two flaps.  I debrided the end of the great toe where there was  some draining  infected material.  I sent that down to the lab for  cultures and sensitivity as well as a swab of the area.  Following that  I went down and identified the remaining bone in the great toe down to  the first metatarsal and removed that.  We thoroughly cleaned it out,  debrided the tendon and all and then loosely approximated the two flaps.  Note, his circulation to that extremity is very guarded.  It was guarded  prior to his fracture.  Obviously his toe was self amputated at the very  end and that is how he came in with the draining toe.  Also he had a  cellulitis of his foot as well.  He may very well end up with a below-  the-knee amputation in the future.  Please make sure it is all  documented.           ______________________________  Georges Lynch. Darrelyn Hillock, M.D.     RAG/MEDQ  D:  01/13/2008  T:  01/13/2008  Job:  [id number]   cc:   Vikki Ports A. Felicity Coyer, MD  [address] [place], [place] [address]

## 2010-12-09 NOTE — Op Note (Signed)
Ivan [name], Ivan [name] NO.:  [id number]   MEDICAL RECORD NO.:  [id number]           PATIENT TYPE:   LOCATION:                                 FACILITY:   PHYSICIAN:  Di Kindle. Edilia Bo, M.D.DATE OF BIRTH:  03-Feb-1927   DATE OF PROCEDURE:  02/13/2008  DATE OF DISCHARGE:                               OPERATIVE REPORT   PREOPERATIVE DIAGNOSES:  Nonhealing right below-the-knee amputation and  superficial wound of left heel.   POSTOPERATIVE DIAGNOSES:  Nonhealing right below-the-knee amputation and  superficial wound of left heel.   PROCEDURE:  1. Aortogram.  2. Bilateral iliac arteriogram.  3. Bilateral lower extremity runoff.   SURGEON:  Di Kindle. Edilia Bo, MD   ANESTHESIA:  Local.   TECHNIQUE:  The patient was taken to the PV Lab at [facility], and under  ultrasound guidance and after the skin was anesthetized, the left common  femoral artery was cannulated and a guidewire introduced into the  infrarenal aorta under fluoroscopic control.  A 5-French sheath was  introduced over the wire.  Next, pigtail catheter was positioned at the  L1 vertebral body and flush aortogram obtained.  The catheter was then  repositioned above the aortic bifurcation and oblique iliac projections  were obtained.  The right lower extremity runoff films were obtained.  Next, the pigtail catheter was removed over a wire and left lower  extremity runoff films obtained.   FINDINGS:  There were 2 renal arteries on the right and a single renal  artery on the left.  No significant renal artery stenosis is identified.  The infrarenal aorta, bilateral common iliac arteries, bilateral  external iliac arteries, and bilateral internal iliac arteries are  widely patent.   On the right side, the common femoral artery is occluded at the groin  just above the bifurcation and there is reconstitution of the deep  femoral artery via extensive collaterals.  The superficial femoral  artery is  occluded.  There is reconstitution of the superficial femoral  artery in the mid thigh, but superficial femoral artery and proximal  popliteal artery have severe diffuse disease.  The below-knee popliteal  artery is patent.  The patient has a below-the-knee amputation on the  right.   On the left side, the common femoral and deep femoral artery are patent.  There is some mild diffuse disease of the proximal superficial femoral  artery.  This then a short segment occlusion on the left with a patent  superficial femoral and popliteal artery below this, although there is  some mild diffuse disease.  There is three-vessel runoff on the left via  the anterior tibial peroneal and posterior tibial arteries, although the  peroneal appears to be occluded distally.   CONCLUSIONS:  1. 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  2. Short segment left superficial femoral artery occlusion on the      left.      Di Kindle. Edilia Bo, M.D.  Electronically Signed     CSD/MEDQ  D:  02/13/2008  T:  02/13/2008  Job:  [id number]

## 2010-12-09 NOTE — Letter (Signed)
January 16, 2009   Gloriajean Dell. Andrey Campanile, MD  [address]  [place], [place] [address]   Re:  HARRIET, BOLLEN                  DOB:  July 18, 1927   Dear Merlyn Albert,   I saw the patient back today.  We bronchoscoped him approximately 4 days  ago and saw a lesion in his right middle lobe which we took pictures of  it and then biopsied.  It is interesting that the lesion came back, it  is not a cancer, but it is some type of foreign body.  It has obviously  been there for a long period of time.  He gave the history of couple of  months ago following aspirating the peanut, so his problems may be  related to chronic aspiration of foreign body.  For this reason, we are  going to take him back to the operating room under general anesthesia  and do more biopsies and probable removal of this foreign body in the  right middle lobe.  I will let you know the final pathology.  I  appreciate the opportunity of seeing the patient.   Sincerely,   Ines Bloomer, M.D.  Electronically Signed   DPB/MEDQ  D:  01/16/2009  T:  01/17/2009  Job:  [id number]

## 2010-12-09 NOTE — H&P (Signed)
NAMETIGRAN, HAYNIE NO.:  [id number]   MEDICAL RECORD NO.:  [id number]          PATIENT TYPE:  INP   LOCATION:  1540                         FACILITY:  [facility]   PHYSICIAN:  Ramiro Harvest, MD    DATE OF BIRTH:  [birth year]   DATE OF ADMISSION:  01/11/2008  DATE OF DISCHARGE:                              HISTORY & PHYSICAL   PRIMARY CARE PHYSICIAN:  Dr. Jonny Ruiz of [facility].   HISTORY OF PRESENT ILLNESS:  Antoine Vandermeulen is a 77 year old white male with  history of type 2 diabetes, hypertension and hyperlipidemia, who  presents to the ED after he fell at home on his right side.  The patient  states that he uses a walker at home and after feedings his animals, he  tried maneuvering his walker to the front of his house when he lost his  balance and fell on the right side, was unable to get up and was in  pain.  The patient yelled out for his neighbor who came by and called  EMS, and the patient was brought to the ED.  Of note, the patient also  states he burnt the pads of his left second and third finger on a hot  pot of popcorn 2 weeks prior to admission.  The patient also states that  1 week prior to admission, he had an infection of his right great toe.  He tried treating it with peroxide, and the toe later fell off and was  black.  The patient denies any fevers.  No chills, no nausea, no  vomiting, no chest pain, no shortness of breath, no syncope, no  presyncope, no abdominal pain, no melena, no hematemesis and no  hematochezia.  No focal neurological symptoms.  The patient was seen in  the ED.  X-rays revealed a right mid femoral shaft fracture, also  cellulitis of the right foot.  CBC with a white count of 11.9,  hemoglobin 13, platelets 228.  CMET was unremarkable, except for an  albumin of 3.2.  UA was bland.  Ortho was consulted and we were called  to admit the patient for further evaluation and management.  The patient  denies any diaphoresis.   ALLERGIES:  PENICILLIN CAUSES A RASH.   PAST MEDICAL HISTORY:  1. Hypertension.  2. Hyperlipidemia.  3. Type 2 diabetes.  4. Status post right hip arthroplasty.  5. Chronic low back pain.   HOME MEDICATIONS:  Actos Plus, benazepril, Chlordiazepoxide, Pravachol,  propafenone and acetaminophen.   The patient goes [employer] on [address], and the patient's  home medications will need to be verified per pharmacy or per PCP.   SOCIAL HISTORY:  The patient lives in [place], he lives alone.  Prior  tobacco use, quit 17 years ago.  Occasional alcohol use.  The patient  has four stepdaughters, a son and another daughter.  The patient has one  daughter that is deceased from asthma exacerbation.  The patient is also  currently widowed.  No IV drug use.   FAMILY HISTORY:  Noncontributory.   REVIEW  OF SYSTEMS:  As per HPI, otherwise negative.   PHYSICAL EXAMINATION:  VITAL SIGNS:  Temperature 97.3, blood pressure  131/67, pulse 73, respiratory rate 22, satting 98% on room air.  GENERAL:  The patient is no acute distress.  HEENT:  Normocephalic, atraumatic.  Pupils equal, round and reactive to  light.  Extraocular movements intact.  Oropharynx is clear.  Positive  dentures.  No lesions, no exudates.  NECK:  Supple.  No lymphadenopathy.  RESPIRATORY:  Lungs are clear to auscultation bilaterally.  No wheezes,  no rhonchi, no crackles.  CARDIOVASCULAR:  Regular rate and rhythm.  No  murmurs, rubs or gallops.  ABDOMEN:  Soft, nontender, nondistended.  Positive bowel sounds.  EXTREMITIES:  No clubbing or cyanosis.  The right foot in a knee  immobilizer and Buck's traction with 5 pounds.  The area has also been  cleaned out.  The nub of the left great toe with yellowish drainage,  erythema to the foot up to a third of the shin, nontender to palpation.  Positive warmth, minimal edema.  The left second and third fingers were  burned to the pads.  No clubbing, no cyanosis.   NEUROLOGICAL:  The patient alert and oriented x3.  Cranial nerves II-XII  grossly intact.  No focal deficits.   LABORATORY DATA:  CBC - white count 11.9, hemoglobin 13.0, platelets  228, ANC of 9.8, hematocrit 38.8.  CMET - sodium 136, potassium 3.7,  chloride 101, bicarb 25, BUN 7, creatinine 0.70, glucose of 142,  bilirubin 0.7, alkaline phosphatase 116, AST 24, ALT 17, PTT 32, PT  13.0, INR 1.0.  Blood cultures are pending as well.  Plain films of the  right foot showed no acute fracture or dislocation of the right foot,  mildly limited by position, probable dorsal forefoot soft tissue  swelling, presumably postsurgical defect in the first phalanx.  Plain  films of the right ankle, no acute findings about the right ankle.  Plain films of the right femur shows markedly angulated overriding mid  femoral shaft fracture.  Chest x-ray, no acute cardiopulmonary disease.   ASSESSMENT AND PLAN:  Mr. Robley Matassa in the 77 year old gentleman with a  history of type 2 diabetes, hypertension and hyperlipidemia who presents  to the emergency department with a fall and a mid femoral shaft  fracture.   PROBLEMS:  1. Cellulitis.  The patient is a diabetic and the right great toe      recently fell off, likely secondary to gangrenous toe.  Will check      blood cultures x2.  Will check wound cultures.  Will place the      patient empirically on IV vancomycin and IV imipenem for empiric      coverage while awaiting the culture results.  Will monitor for now.  2. Hypertension.  Continue benazepril.  3. Type 2 diabetes.  Check a hemoglobin A1c.  The patient is currently      n.p.o. for possible procedure.  Place on CBGs q.4 h., and a sliding      scale insulin.  4. Right femoral shaft fracture.  Pain management per orthopedics.  5. Chronic pain.  6. Hyperlipidemia.  Check a fasting lipid panel and Pravachol will be      utilized.  7. Prophylaxis.  Protonix for gastrointestinal prophylaxis.  Will       defer deep venous thrombosis prophylaxis to orthopedics.   It was a pleasure taking care of Mr. Treyvin Glidden.  Ramiro Harvest, MD  Electronically Signed     DT/MEDQ  D:  01/11/2008  T:  01/11/2008  Job:  [id number]

## 2010-12-09 NOTE — Assessment & Plan Note (Signed)
OFFICE VISIT   EIVIN, MASCIO  DOB:  August 16, 1926                                        February 20, 2009  CHART #:  [id number]   The patient came for followup today and he is doing well overall.  His  chest x-ray showed further improvements and they are marked in the right  middle lobe and right lower lobe where he had an impacted foreign body  which we removed.  His lungs were clear to auscultation and percussion.  I will see him back again if he has any future problems.   Ines Bloomer, M.D.  Electronically Signed   DPB/MEDQ  D:  02/20/2009  T:  02/21/2009  Job:  [id number]

## 2010-12-09 NOTE — Discharge Summary (Signed)
Ivan [name], Ivan [name] NO.:  [id number]   MEDICAL RECORD NO.:  [id number]          PATIENT TYPE:  OIB   LOCATION:  1526                         [facility]ACILITY:  [facility]   PHYSICIAN:  Georges Lynch. Gioffre, M.D.DATE OF BIRTH:  05/01/1927   DATE OF ADMISSION:  04/25/2008  DATE OF DISCHARGE:  04/27/2008                               DISCHARGE SUMMARY   He was taken to surgery by me on April 24, 2008, because of the  necrotic below-the-knee amputation stump.  This was converted to an  above-the-knee amputation.  At that time we applied the wound VAC for  the wound in order to move as much of the postop fluid as we could for  healing purposes since he had a great deal of trouble with wound healing  before.  His postop course was really uneventful.  He does have a very  small pressure area off the left heel which is dry and we have been  dressing that.   DATA:  Hemoglobin 11.3, hematocrit 33.8 and that was done on September  30.  The sodium was 139, potassium 4.5, chloride 101, glucose 113, BUN  22, creatinine 0.61, calcium 9.8.  His SGOT 20, SGPT 27.  Alkaline  phosphatase 102, bilirubin 0.6.  His INR was 1 and the PT was 13.2 on  September 29 the PTT was 34.  He had chest x-rays that were negative.  A  chest x-ray that was negative.  EKG showed some left axis deviation and  right bundle branch block.   So basically he is going to be discharged today on October 2.  He was  instructed to see me in the office.   DISCHARGE MEDICATIONS:  1. He will be on Coumadin low dose 2.5 mg.  2. Colace 100 mg b.i.d. for stool softener.  3. Ferrous sulfate 325 mg t.i.d. just for 2 weeks then stop it.  4. Librium 10 mg for sleep.  5. MiraLax he takes daily 17 gram powder, dissolve 17 grams in 8      ounces of water.  6. Avodart 0.5 mg a day.  7. Flomax 0.4 mg a day.  8. Glucophage 850 mg with food a day.  9. He is on protein supplement.  Femme protein.  10.He is also on NovoLog insulin  and he needs to sliding scale that,      needs to be monitored by his medical doctors at [facility].  11.For pain he is on Percocet 5/325 one every 4 hours p.r.n. for pain.   DISCHARGE INSTRUCTIONS:  1. He should maintain the wound VAC that he has on now.  The pressure      is automatically set.  2. A pillow should be kept under the left leg.  He needs to keep his      left heel off the bed and have his heel inspected daily.  3. He is allowed up from bed to chair only with help.  4. He has to have his INR monitored once a week since he is on low-      dose heparin  2.5 mg.  5. Call [phone], that is my office.  I need to see him on Tuesday of      next week to remove his vacuum pump.  6. He is diabetic.  Make sure that he has CBCs monitored 3 times a day      with no insulin coverage at bedtime and that needs to be monitored      by his medical doctor at [facility].           ______________________________  Georges Lynch. Darrelyn Hillock, M.D.     RAG/MEDQ  D:  04/27/2008  T:  04/27/2008  Job:  [id number]

## 2010-12-09 NOTE — Discharge Summary (Signed)
NAMENIXON, KOLTON NO.:  [id number]   MEDICAL RECORD NO.:  [id number]          PATIENT TYPE:  INP   LOCATION:  5522                         FACILITY:  [facility]   PHYSICIAN:  Valerie A. Felicity Coyer, MDDATE OF BIRTH:  07-Apr-1927   DATE OF ADMISSION:  02/08/2008  DATE OF DISCHARGE:  02/15/2008                               DISCHARGE SUMMARY   DISCHARGE DIAGNOSES:  1. Altered mental status on admit, likely multifactorial secondary to      below, now at baseline.  2. Urinary tract infection, with positive urinalysis on admit.  3. Severe peripheral vascular disease, status post right below-knee      amputation on January 29, 2008, with nonhealing wound.  4. Left heel wound.  5. Chronic iron deficiency anemia.  6. Dyslipidemia.  7. Mild rhabdomyolysis on admission likely secondary to surgery plus      increased immobilization   HISTORY OF PRESENT ILLNESS:  Mr. Ivan [name] is a 77 year old white male with  a past medical history of severe peripheral vascular disease, status  post right BKA in July 2009, also with history of hyperlipidemia,  hypertension, and chronic anemia.  Also of note, the patient sustained a  right femur fracture status post fall on January 11, 2008, at which time he  underwent ORIF and amputation of right great toe due to wound that had  been developing after injury.  The patient was discharged from that  particular hospitalization on February 03, 2008 to [facility].  On day of admission, the patient was sent to the emergency  room from the skilled nursing facility with complaints of altered mental  status.  Evaluation in the ER revealed a urinary tract infection with  positive UA, question whether or not the patient's altered mental status  was secondary to narcotics.  Patient also with mild increase in total CK  on admission.  The patient was admitted at that time for further  evaluation and treatment.   PAST MEDICAL HISTORY:  1. Type 2  diabetes.  2. Right lower extremity gangrene, status post right below-knee      amputation on January 29, 2008.  3. Hyperlipidemia.  4. Hypertension.  5. Right femur fracture, status post ORIF January 13, 2008.  6. Hyperlipidemia.  7. Hypertension.  8. Iron deficiency anemia.  9. Chronic low back pain.   PROCEDURES PERFORMED DURING THIS HOSPITALIZATION:  1. Aortogram  2. Bilateral iliac arteriogram.   COURSE OF HOSPITALIZATION:  1. UTI, with positive urinalysis on admit, likely contributing to the      patient's altered mental status on admission.  However, urine      culture was negative for any growth.  The patient was treated with      a total of 7 days of IV Cipro.  Mental status at baseline upon      dictation.  No further antibiotic treatment deemed necessary at      time of discharge.  2. Severe peripheral vascular disease, status post right BKA in July      2009, with nonhealing wound. Patient also  with nonhealing wound of      left heel.  Patient felt not a candidate for right AKA, as per      discussion with the patient's orthopedist feels that the patient      would likely need hip disarticulation secondary to history of femur      fracture with plate fixation of distal femoral fracture in June      2009.  Will continue wound care, with outpatient follow-up with Dr.      Darrelyn Hillock to be scheduled by the patient's family.  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  Again, the plan now is to continue wound care on both left      heel ulcer and right BKA ulcer. The patient did undergo 2 units of      packed red blood cells to increase perfusion during this      hospitalization.  3. Mild rhabdomyolysis on admission.  Increase in total CK likely      secondary to recent surgery and  immobilization. CK on admission      3162.  The patient's statin was placed on hold, and to continue on      hold indefinitely at time of discharge.  Resuming this medication      will be deferred to the patient's primary care physician.   MEDICATIONS AT TIME OF DISCHARGE:  1. Ferrous sulfate 325 mg p.o. t.i.d.  2. Glucophage 850 mg p.o. daily.  3. Benazepril 40 mg p.o. daily.  4. Actos 15 mg p.o. daily.  5. Colace 100 mg p.o. b.i.d.  6. Librium 10 mg p.o. at bedtime.  7. MiraLax 17 g p.o. daily, hold for diarrhea.  8. Sliding scale NovoLog insulin, moderate scale.  9. Phenergan 12.5 mg p.o. q.6 h. p.r.n.  10.Robaxin 500 mg p.o. q.6 h. p.r.n.  11.Darvocet-N 100, 1 tablet p.o. q.6 h. p.r.n.  12.Ambien 5 mg p.o. at bedtime p.r.n.  13.Coumadin 2.5 mg p.o. daily.  14.Patient to discontinue Zocor, to be resumed by primary care      physician per discretion.   PHYSICAL EXAMINATION AT TIME OF DISCHARGE:  VITAL SIGNS:  Blood pressure  142/62, heart rate 61, respirations 18, temperature 97.6, O2 saturation  98% on room air.  GENERAL:  This is an elderly white male, awake and alert, in no acute  distress.  HEENT:  Head normocephalic, atraumatic.  Eyes: Pupils are equal, round,  and reactive to light.  No scleral icterus or injection.  Ears, nose,  and throat:  Mucous membranes are moist, with no suspicious lesions.  NECK:  Supple, with no thyromegaly or lymphadenopathy.  CHEST:  With symmetrical movement, nontender to palpation.  CARDIOVASCULAR:  S1, S2.  Regular rate and rhythm.  No murmurs, rubs, or  gallops.  No lower extremity edema.  RESPIRATORY:  Lung sounds are clear to auscultation bilaterally.  No  increased work of breathing.  No wheezes, rales, or crackles.  ABDOMEN:  Soft, nontender, with positive bowel sounds.  No appreciated  hepatosplenomegaly.  EXTREMITIES.  The patient is status post right BKA.  Patient also with  early pressure sore on left heel, appearing superficial  at this time.  Right BKA with some small areas of full-thickness skin necrosis, with  poor wound healing.   DISPOSITION:  Patient to be discharge at this time to skilled nursing  facility to continue aggressive wound care treatment to right BKA and to  left heel ulcer.  Per vascular surgeon. the patient should have adequate  amount of  circulation in order to heal BKA. Family to schedule follow-up  appointment with Dr. Darrelyn Hillock approximately 2 weeks postdischarge.  The  patient can follow up with his primary care physician, Dr. Oliver Barre,  as needed.   Greater than 30 minutes spent on discharge planning.      Cordelia Pen, NP      Raenette Rover. Felicity Coyer, MD  Electronically Signed    LE/MEDQ  D:  02/15/2008  T:  02/15/2008  Job:  [id number]   cc:   Corwin Levins, MD  Georges Lynch Darrelyn Hillock, M.D.

## 2010-12-09 NOTE — Consult Note (Signed)
Ivan [name], ROLPH NO.:  [id number]   MEDICAL RECORD NO.:  [id number]          PATIENT TYPE:  INP   LOCATION:  5501                         FACILITY:  [facility]   PHYSICIAN:  Di Kindle. Edilia Bo, M.D.DATE OF BIRTH:  Dec 14, 1926   DATE OF CONSULTATION:  02/09/2008  DATE OF DISCHARGE:                                 CONSULTATION   REASON FOR CONSULTATION:  Nonhealing right below-the-knee amputation.   REFERRING PHYSICIAN:  Windy Fast A. Gioffre, MD   HISTORY:  This is a pleasant 77 year old gentleman who fell on January 11, 2008 and sustained a right femur fracture.  He underwent ORIF of the  right femur fracture and amputation of the end of his right great toe  which apparently was a wound he had developed after injuring his toe  earlier in July.  The wound on the toe failed to heal and ultimately he  presented with gangrene of the right foot and required right below-the-  knee amputation which was performed by Dr. Darrelyn Hillock on January 29, 2008.  He  was admitted, he had been discharged, and then was readmitted on February 08, 2008 with change in mental status.  Of note, he had been on MS  Contin and currently it was felt that most likely his change in mental  status is related to his pain medications.  Other considerations were  urinary tract infection, although I do not see any recent urine  cultures.  During this hospitalization, it was noted that his right  below-the-knee amputation site was not healing and Vascular Surgery was  consulted.  Of note, on my history from the patient, I did not get any  clear-cut history of claudication, rest pain or nonhealing ulcers from  the patient, although I think his activity was very limited.   PAST MEDICAL HISTORY:  Significant for adult onset diabetes which is non-  insulin dependent.  In addition, he has,  1. Hyperlipidemia.  2. Hypertension.  3. History of iron deficiency anemia.  4. History of chronic low back pain.  5. He  denies any history of previous myocardial infarction, history of      congestive heart failure, or history of COPD.   PAST SURGICAL HISTORY:  1. Previous right hip replacement.  2. ORIF of his right femur fracture.  3. Right below-the-knee amputation.   SOCIAL HISTORY:  He is widowed.  He has 4 children.  He has a remote  history of tobacco use.   FAMILY HISTORY:  There is no history of premature cardiovascular  disease.   MEDICATIONS ON ADMISSION:  1. Coumadin 2.5 mg p.o. daily.  2. Colace 100 mg p.o. b.i.d.  3. Iron 325 mg p.o. t.i.d.  4. Zocor 20 mg p.o. daily.  5. Actos 15 mg p.o. daily.  6. Lotensin 40 mg p.o. daily.  7. Glucophage 850 mg p.o. daily.  8. Librium 10 mg at bedtime.  9. MiraLax 17 grams daily.  10.Insulin sliding scale.  11.Darvocet-N 100 one to two every 4-6 hours p.r.n.  12.Robaxin 500 mg q.6 h. p.r.n.  13.Phenergan 12.5 mg  q.6 h. p.r.n.  14.Ambien 5 mg p.o. at bedtime p.r.n.   REVIEW OF SYSTEMS:  GENERAL:  He has had no recent weight loss, weight  gain, or problems with his appetite.  CARDIAC:  He has had no chest  pain, chest pressure, palpitations, or arrhythmias.  PULMONARY:  He has  had no recent productive cough, bronchitis, or asthma, or wheezing.  He  does admit to dyspnea on exertion.  HEENT:  He has had no recent vision  changes and had no tinnitus.  GI:  He has had no recent change in his  bowel habits and has no history of peptic ulcer disease.  GU:  He has  had no recent dysuria.  VASCULAR:  He gives no history of claudication,  rest pain, or previous nonhealing ulcers.  He has had no previous DVTs  that he is aware of or phlebitis.  NEURO:  He has had no dizziness,  blackouts, headaches, or seizures.  He denies any history of previous  stroke, TIAs, or amaurosis fugax.  HEMATOLOGIC:  He has had no bleeding  problems or clotting disorders that he is aware of.  He had been  supratherapeutic on his Coumadin and this has been corrected.    PHYSICAL EXAMINATION:  GENERAL:  This is a pleasant 77 year old  gentleman who appears his stated age.  VITAL SIGNS:  His temperature is 97.9, heart rate is 70, blood pressure  123/61, saturation 98%.  HEENT:  Extraocular motions are intact.  NECK:  Supple.  There is no cervical lymphadenopathy.  I do not detect  any carotid bruits.  LUNGS:  Clear bilaterally to auscultation.  CARDIAC:  He has a regular rate and rhythm.  ABDOMEN:  Soft and nontender.  He has normal pitched bowel sounds.  I  cannot palpate an aneurysm.  I cannot palpate a right femoral pulse.  He  does have a monophasic femoral signal with a Doppler.  He does have a  palpable left femoral pulse which is reasonable.  He has no popliteal  pulse on the right or left and no palpable pulses in the left foot,  although he does have reasonable monophasic Doppler signals in the  dorsalis pedis and posterior tibial position on the left.  He has a very  early pressure sore on the left heel with no full thickness injury  currently.  On the right BKA, there is cellulitis and some small areas  of full-thickness of skin necrosis, this is healing poorly.   Currently, he is still on his Coumadin.  His INR today was 1.8.  His  potassium is 5.5.  His creatinine is 1.2.  White blood cell count 10,  H&H 8 and 25, platelets 321.   IMPRESSION:  This patient has a poorly-healing right below-the-knee  amputation.  There are some areas of full-thickness necrosis and I doubt  that this can be salvaged.  Based on his exam and he has evidence of  significant right iliac artery occlusive disease and it is combined with  his diabetes certainly is putting him at high risk for nonhealing.  On  the left side, he has evidence of infrainguinal arterial occlusive  disease, but currently the wound on the heel is quite superficial and  early and I think this will likely gradually heal despite his  infrainguinal arterial occlusive disease.  The nurses are  doing an  excellent job of floating heel and currently he is in a heel boot on the  left.  I would recommend, however, arteriography in order to evaluate him for  possible right iliac percutaneous transluminal angioplasty.  However,  even if we are able to address an iliac stenosis and improve the  circulation, it is likely that the right below-knee-amputation will not  be salvageable.  However, I think addressing an iliac lesion could  potentially help him heal an above-knee-amputation.  In addition, this  will allow [place] to evaluate the left side if the heel worsens on the left.  I have discussed with the family the indications for arteriography and  the potential complications including but not limited to bleeding,  arterial injury, dye reaction, and kidney failure.  They currently feel  uncomfortable proceeding with arteriography tomorrow as he has had been  very sick up until yesterday, he has shown some significant improvement  today.  I think this is reasonable and therefore we will tentatively  hold off until Monday.  He will need to stop his Coumadin for a few days  prior to his procedure.  We will make further recommendations pending  results of his arteriogram.  If we did see an iliac lesion on the right  which could be addressed at the same time, we would attempt to address  this at the same time.  We will follow for now and in addition we will  obtain baseline ABIs on the left.      Di Kindle. Edilia Bo, M.D.  Electronically Signed     CSD/MEDQ  D:  02/09/2008  T:  02/10/2008  Job:  [id number]   cc:   Windy Fast A. Darrelyn Hillock, M.D.

## 2010-12-09 NOTE — Op Note (Signed)
NAMEDORN, HARTSHORNE                  ACCOUNT NO.:  [id number]   MEDICAL RECORD NO.:  [id number]          PATIENT TYPE:  AMB   LOCATION:  SDS                          FACILITY:  [facility]   PHYSICIAN:  Ines Bloomer, M.D. DATE OF BIRTH:  Jun 03, 1927   DATE OF PROCEDURE:  01/10/2009  DATE OF DISCHARGE:                               OPERATIVE REPORT   PREOPERATIVE DIAGNOSIS:  Right middle lobe and right lower lobe  atelectasis.   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   OPERATION PERFORMED:  Fiberoptic bronchoscopy.   SURGEON:  Ines Bloomer, MD   ANESTHESIA:  Cetacaine and Xylocaine.   DESCRIPTION OF PROCEDURE:  After local anesthesia with Cetacaine,  Xylocaine, and IV sedation, the patient was prepped and draped in the  usual sterile manner and we passed the bronchoscope through the mouth.  The cords were in the midline.  The carina was at the midline.  The  distal trachea was normal.  The left mainstem, left upper lobe, and left  lower lobe orifices were normal.  I then passed down the right mainstem  bronchus.  There was a very large hypertrophic posterior membrane of the  distal trachea and down the right mainstem bronchus.  Right upper lobe  takeoff was normal, but in the bronchus intermedius there was an obvious  tumor.  We did multiple biopsies.  We were able to pass the tumor to the  right lower lobe where the right middle lobe orifices could also be  seen.  Brushings were also taken from this area.  The bronchoscope was  removed.  The patient returned to the recovery room in stable condition.      Ines Bloomer, M.D.  Electronically Signed     DPB/MEDQ  D:  01/10/2009  T:  01/11/2009  Job:  [id number]

## 2010-12-09 NOTE — H&P (Signed)
Ivan [name], MORINA NO.:  [id number]   MEDICAL RECORD NO.:  [id number]          PATIENT TYPE:  INP   LOCATION:  1602                         FACILITY:  [facility]   PHYSICIAN:  Ivan Lynch. Gioffre, M.D.DATE OF BIRTH:  04/10/1927   DATE OF ADMISSION:  01/29/2008  DATE OF DISCHARGE:                              HISTORY & PHYSICAL   Ivan [name] previous surgery by me for open reduction and internal  fixation of a periprosthetic fracture of his right hip.  This was done  several weeks ago at [facility].  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  He does have a history of diabetes.  He  presented to the emergency room at [facility] today and because  of inability to operate on him at [facility] because of the so-called rooms  were tied up for hours I transferred him to [facility] for surgery.  He  basically came in because of the progression of his arteriosclerotic  disease and his dry gangrene of his right leg.  He began to run a  temperature and the previous wound site of his foot was dry and necrotic  and his leg was tender to touch and he obviously is going to need a  below-the-knee amputation done soon.  His INR when he came in was 7.7.  He has been on Coumadin.  He is being admitted with that history.   PAST MEDICAL HISTORY:  He has a history of hypercholesterolemia,  hypertension, osteomyelitis and diabetes.   SOCIAL HISTORY:  He is not a drinker nor a smoker.   MEDICATIONS:  1. He is on ACTOplus 15/850.  2. Benazepril 40 mg a day (check that dose).  3. Chlordiazepoxide hydrochloride 10 mg hours of sleep.  4. Pravachol 40 mg hours of sleep.  5. Colace 100 mg a day.  6. Coumadin, we are going to hold the Coumadin.  He was on 10 mg a      day.  7. Ferrous sulfate 325 mg t.i.d.  8. MiraLax 17 grams a day.   PHYSICAL EXAMINATION:  GENERAL/VITAL SIGNS:  He is  alert and oriented  but he has a temperature of 100.2.  His blood pressure was 131/63, his  pulse 78, respirations 18.  HEAD AND NECK:  Negative.  Mouth clear.  BACK:  Negative.  LUNGS:  Clear.  HEART:  Normal sinus rhythm with no murmurs.  ABDOMEN:  Negative.  UPPER EXTREMITIES:  Negative.  Hips, he has some serous drainage from  his distal thigh wound on the right.  He [name] previous open reduction and  internal fixation of this site.  The left hip negative.  The right knee  negative.  The right leg has obvious vascular compromise with dry  gangrene of the right foot.   X-rays of his foot just shows the amputation of the great toe.  The  chest x-ray was done and we are waiting for the report.  His lab  studies, his PTT was 68.3.  His INR was 7.7.  His hemoglobin 12.2,  hematocrit 36, glucose 144, sodium 133, potassium 5.1, chloride 100, BUN  20, creatinine 0.8.  The white count was 12,200.   IMPRESSION:  1. Postop open reduction and internal fixation of a periprosthetic      fracture right hip.  2. Rule out early infection of the right hip wound.  3. Dry gangrene of the right lower extremity.   COMMENT:  I am going to go ahead and schedule him for a below-the-knee  amputation on the right.  I did explain to his family that he may end up  with an amputation above the knee if we are not successful with this  because of his poor vascular supply.  We did give him 10 units of  vitamin K and we will have fresh frozen plasma on hand and will get his  INR down to the decent level before we take him to surgery.           ______________________________  Ivan [name], M.D.     RAG/MEDQ  D:  01/29/2008  T:  01/29/2008  Job:  [id number]

## 2010-12-09 NOTE — Consult Note (Signed)
VASCULAR SURGERY CONSULTATION   Ivan [name], Ivan [name]  DOB:  09-Nov-1926                                       05/01/2008  WJXBJ#:[id number]   The patient was referred for vascular surgery consultation by Dr.  Eloise Harman for a pressure sore on the left heel.  This 77 year old patient  with non-insulin-dependent diabetes mellitus recently had a right below  knee amputation performed by Dr. Worthy Rancher which was then revised to  an above knee amputation.  Over the last several weeks he has developed  a small pressure sore on his left heel which he has not had in the past.  He is obviously nonambulatory at this time and is residing at [facility]  nursing home while his right above knee amputation stump is healing with  a VAC suction device in place.   PAST MEDICAL HISTORY:  1. Non-insulin-dependent diabetes mellitus.  2. Hypertension.  3. Hyperlipidemia.  4. Negative for coronary artery disease, COPD or stroke.   PAST SURGICAL HISTORY:  1. Right below knee amputation.  2. Right above knee amputation.  3. Right inguinal hernia repair.  4. Right rotator cuff surgery.  5. Some type of skin graft to the right periorbital area.   FAMILY HISTORY:  Positive for coronary artery disease, diabetes in  brothers and stroke in his father.   SOCIAL HISTORY:  He is widowed, has four children and is retired.  He  quit smoking 17 years and does not use alcohol.   REVIEW OF SYSTEMS:  Does have chronic constipation, urinary frequency,  diffuse arthritis, joint pain, muscle pain as well as some nervousness,  depression.   ALLERGIES:  None known.   PHYSICAL EXAMINATION:  Vital signs:  Blood pressure is 132/70, heart  rate 79, respirations 14.  General:  He is a chronically ill elderly  male in no apparent distress in a wheelchair.  Neck:  Neck is supple  with 3+ carotid pulses.  He is alert and oriented x3.  Chest:  Clear to  auscultation.  Neurologic:  Exam is normal.  Cardiovascular:   Reveals a  regular rhythm.  No murmurs.  Abdomen:  Soft, nontender with no masses.  He has 3+ femoral pulses bilaterally.  There is an above knee amputation  on the right with a VAC in place over the wound.  On the left there is a  1-2+ popliteal pulse and a 1+ pedal and posterior tibial pulses which  are difficult to palpate.  His foot is warm and adequately perfused.  There is a well circumscribed eschar over the left heel in the dependent  position measuring about a 1 cm x 2.5 cm with no purulent drainage or  exposed bone.  Mild erythema surrounding this.  Otherwise the foot is  free of ischemic ulcers.   Vascular lab studies revealed calcified vessels in the left leg which  are noncompressible consistent with diabetes.  He does have biphasic  flow in the left posterior tibial and dorsalis pedis arteries.  I think  he does have diffuse calcification of his vessels but revascularization  will not improve the circulation to his left foot since he does have  biphasic flow at this time.  The treatment is to avoid any type of  pressure to the left heel and to keep this clean and hopefully it will  heal from within.  He certainly does have potential of this pressure  sore continuing to deteriorate requiring an amputation on the left leg.   Quita Skye Hart Rochester, M.D.  Electronically Signed  JDL/MEDQ  D:  05/01/2008  T:  05/02/2008  Job:  [id number]   cc:   Barry Dienes. Eloise Harman, M.D.

## 2010-12-09 NOTE — H&P (Signed)
NAMEBESNIK, FEBUS NO.:  [id number]   MEDICAL RECORD NO.:  [id number]          PATIENT TYPE:  INP   LOCATION:  5501                         FACILITY:  [facility]   PHYSICIAN:  Hollice Espy, M.D.DATE OF BIRTH:  Nov 30, 1926   DATE OF ADMISSION:  02/08/2008  DATE OF DISCHARGE:                              HISTORY & PHYSICAL   PRIMARY CARE PHYSICIAN:  The patient's primary care physician is Corwin Levins, M.D.   ORTHOPEDIC SURGEON:  The patient's orthopedic surgeon is Ronald A.  Gioffre, M.D.   CHIEF COMPLAINT:  Altered mental status.   HISTORY OF THE PRESENT ILLNESS:  The patient is a 77 year old white male  who was just discharged from Dr. Jeannetta Ellis orthopedic service on February 03, 2008, less than 1 week ago.  He came in at that time with dry  gangrene of the right lower extremity and underwent a below-the-knee  amputation of the right lower extremity.  His hospital course was  complicated by a supertherapeutic INR.  The patient also had a recent  right hip periprosthetic fracture with open reduction.  The patient was  discharged to a nursing home facility on February 03, 2008.  Since that time  he was doing relatively well for several days, but then his wife noted,  over the last day, that he had been very somnolent, very weak and  slightly confused.  She became concerned and the patient was brought  into the emergency room.   In the emergency room the patient was found to have a white count of 11  and a moderate UTI.  The other labs of note include a BUN of 40 with a  creatinine of 1.6; however, his previous BMET at the time of discharge  was normal.  The patient was also given multiple dose of Narcan, which  led to him waking up.  His daughter was concerned about him having too  much MS Contin, which led to his increased periods of somnolence.  When  I saw the patient he was starting to wake up some, but still looks  somewhat confused.  He complains of some  discomfort with the Foley  catheter in.   REVIEW OF SYSTEMS:  Apparently the patient told me that he normally has  trouble voiding and he also told me that he has issues with his  prostate.  Otherwise the patient is doing well.  He denies any chest  pain, shortness of breath, headaches, vision changes, dysphagia,  abdominal pain,  constipation, and diarrhea.  The review of systems is  otherwise negative.   PAST MEDICAL AND SURGICAL HISTORY:  The patient's past medical history  includes:  1. Diabetes mellitus.  2. Recent right lower extremity dry gangrene now status post right      BKA.  3. Hyperlipidemia.  4. Hypertension.  5. Right periprosthetic fracture of the hip status post open reduction      with internal fixation.  6. History of iron deficiency anemia.  7. History of chronic low-back pain.   MEDICATIONS:  Medications based on his discharge  summary from last week  that the patient is on include:  1. Coumadin 2.5 by mouth daily.  2. Colace 100 mg by mouth twice a day.  3. Iron 325 by mouth three times a day.  4. Zocor 20.  5. Actos 15.  6. Lotensin 40.  7. Glucophage 850.  8. Librium 10 at bedtime.  9. MiraLax 17 grams daily.  10.Insulin sliding scale.  11.Darvocet N 100 1-2 tabs by mouth every 4-6 hours as needed.  12.Magic mouthwash as needed.  13.Robaxin 500 every 6 hours as needed.  14.Phenergan 12.5 to 25 every 6 hours as needed.  15.Nystatin powder.  16.Ambien 5 as needed.   ALLERGIES:  The patient has an allergy to PENICILLIN.   SOCIAL HISTORY:  No tobacco, alcohol or drug use.   FAMILY HISTORY:  The family history is noncontributory.   PHYSICAL EXAMINATION:  VITAL SIGNS:  On physical exam the patient's  vital signs on admission are temperature 96.9, heart rate 72, blood  pressure 70/39, but now is up to 100/48 with IV fluids, respirations 14,  and O2 sat is 100% on 2 liters.  GENERAL APPEARANCE:  In general the patient is alert and oriented x2.  He  initially looks to be somewhat drowsy, but easily awakens and answers  questions appropriately.  HEENT:  Head is normocephalic and atraumatic.  His mucous membranes are  slightly dry.  NECK:  The neck reveals no carotid bruits.  HEART:  The heart has a regular rate and rhythm, S1 and S2.  LUNGS:  The lungs are clear to auscultation bilaterally.  ABDOMEN:  The abdomen is soft, nontender and nondistended with positive  bowel sounds.  EXTREMITIES:  The extremities show no clubbing or cyanosis, but there is  trace pitting edema.   LABORATORY DATA:  Lab work shows the chest x-ray is unremarkable.  CT  scan head shows remote appearing bilateral basal ganglia and right  cerebellar lacunar infarcts, but no acute intracranial abnormalities.  The patient has a white count of 11 with 68% neutrophils, H&H 9.7 and  28, MCV 86, and platelet count 304,000.  Sodium 129, potassium 5.6,  chloride 98, bicarb 22, BUN 40, creatinine 1.6, and glucose 112.  The  albumin is low at 2.5.  INR is subtherapeutic at 1.6.  UA notes moderate  leukocyte esterase, 100 protein, small bilirubin, 15  ketones, large  hemoglobin with 7-10 white cells, too numerous to count red cells, and  many bacteria.  CPK greater than 500, MB greater than 80 and troponin-I  less than 0.05.  Isolated markers were ordered.   ASSESSMENT AND PLAN:  1. Altered mental status, likely due to a urinary tract infection      plus/minus narcotics.  We will change narcotics to as needed      Percocet only and hold on any other further sedatives including      Phenergan, muscle relaxants and Ambien.  2. Urinary tract infection.  Intravenous antibiotics.  This is likely      secondary to benign prostatic hypertrophy.  The patient reportedly      has a history of a previous UTI, but I do not see any medications      that he is on or any type of prostate medications.  He may end up      needing an outpatient versus inpatient referral for urology.  3.  Acute renal failure secondary to urinary tract infection plus/minus      rhabdomyolysis.  We  will treat him with intravenous fluids.  4. Rhabdomyolysis secondary to surgery plus increased immobilization.      Again, treat him with intravenous fluids.  5. Hematuria.  Most likely this from a Foley catheter.  We will      recheck him in several days and if this has not improved we will      consider Coumadin.  6. Recent hip fracture.  Continue Coumadin; INR is subtherapeutic.      Hollice Espy, M.D.  Electronically Signed     SKK/MEDQ  D:  02/09/2008  T:  02/09/2008  Job:  [id number]   cc:   Corwin Levins, MD  Georges Lynch. Darrelyn Hillock, M.D.

## 2010-12-09 NOTE — Op Note (Signed)
NAMEARYE, WEYENBERG NO.:  [id number]   MEDICAL RECORD NO.:  [id number]          PATIENT TYPE:  INP   LOCATION:  1602                         FACILITY:  [facility]   PHYSICIAN:  Georges Lynch. Gioffre, M.D.DATE OF BIRTH:  1927-03-06   DATE OF PROCEDURE:  01/29/2008  DATE OF DISCHARGE:                               OPERATIVE REPORT   PREOPERATIVE DIAGNOSES:  1. Rule out infection of the right thigh wound.  2. Previous open reduction and internal fixation of periprosthetic      fracture on the right,  3. Diabetes mellitus.  4. Arteriosclerotic gangrene of the right lower extremity.   POSTOPERATIVE DIAGNOSES:  1. Rule out infection of the right thigh wound.  2. Previous open reduction and internal fixation of periprosthetic      fracture on the right,  3. Diabetes mellitus.  4. Arteriosclerotic gangrene of the right lower extremity.   PROCEDURE:  Under general anesthesia, routine orthopedic prep and  draping of the right lower extremity was carried out.  At this time, we  went down and isolated the foot with sterile dressings to keep it out of  the sterile field.  We made the appropriate markings on the leg for  right below-knee amputation.  The skin was incised bilaterally.  The  incision was carried down to the tibia.  Tibia was marked in the usual  fashion.  The muscle was cut in an organized fashion.  We identified the  vessels and suture-ligated the main vessels.  Note there was some  minimal bleeding, not much, because of his severe arteriosclerosis, but  the muscle did looked good.  We then began our amputation by first  utilizing the saw to make our saw cut through the tibia.  We  osteotomized the tibia, then evened out the distal end.  We then  osteotomized our fibula more proximal to the tibial cut in the usual  fashion.  We then completed our muscle cut and removed the gangrenous  leg.  We thoroughly irrigated out the area.  We inspected the muscle.  We had  good muscle bleeding.  The muscles certainly did look viable.  We  then reapproximated the flap in the usual fashion, and then the skin was  closed with 3-0 nylon suture.  We had a nice closure.  There were no  signs of any infection.  Following that, I then went up to the thigh  wound, opened a small area of the thigh wound distally, where there was  some serous drainage.  This actually was due to the suture.  We removed  the subcutaneous sutures, cultured the area for aerobic and anaerobic,  and left that wound open.  Sterile dressings were applied.  Prior to  surgery, he had 1 g of vancomycin.   PLAN:  1. He will be placed on vancomycin protocol by pharmacy postop.  2. He will be on Coumadin 2.5 mg a day, and will have the INRs      followed by the pharmacist.  Note, preop he had 10 units of vitamin  K, and he had 1 unit of fresh frozen plasma.   SURGEON:  Georges Lynch. Darrelyn Hillock, M.D.   ASSISTANT:  Jamelle Rushing, P.A.           ______________________________  Georges Lynch. Darrelyn Hillock, M.D.     RAG/MEDQ  D:  01/29/2008  T:  01/30/2008  Job:  [id number]   cc:   Rosalyn Gess. Norins, MD  [address]  [place]  [place] [address]

## 2010-12-09 NOTE — Letter (Signed)
January 23, 2009   Gloriajean Dell. Andrey Campanile, MD  [address]  [place], [place] [address]   Re:  EMMITTE, SURGEON                  DOB:  [birth year]   Dear Merlyn Albert,   The patient came back after the removal of peanut from his right lower  lobe.  His chest x-ray still shows some reaction and some atelectasis,  but looks like it is improved.  He still has some wheezing in the right  lower lobe.  There is a tremendous amount of reaction around the peanut  and was very difficult to remove.  I will see him back again in 4 weeks  with another chest x-ray and I may repeat his bronchoscopy at that time  if it has not resolved.  His blood pressure was 138/60, pulse 60,  respirations 18, and saturations were 92%.   Ines Bloomer, M.D.  Electronically Signed   DPB/MEDQ  D:  01/23/2009  T:  01/24/2009  Job:  [id number]

## 2010-12-09 NOTE — Assessment & Plan Note (Signed)
[facility]   NAME:  Ivan [name], Ivan [name] NO.:  [id number]   MEDICAL RECORD NO.:  [id number]      DATE OF BIRTH:  1926-12-06   PHYSICIAN:  Maxwell Caul, M.D. VISIT DATE:  03/16/2008                                   OFFICE VISIT   Ivan [name] is a 77 year old gentleman we have been asked to see to  evaluate for possible benefit of hyperbaric oxygen in the treatment of  an ischemic right below-knee amputation.   HISTORY:  This is a 77 year old man who has had a difficult recent  history.  He initially fell, suffering a right mid femur fracture.  He  underwent ORIF of the right femur fracture along with a right great toe  amputation due to osteomyelitis in mid June.  Unfortunately, the problem  in the right foot progressed in spite of antibiotics and ultimately he  ended up having a right below-knee amputation on January 29, 2008.  He was  readmitted to hospital on February 08, 2008, with altered mental status,  possibly due to UTI.  During that hospitalization, he had an aortogram  and a bilateral iliac arteriogram that showed right common femoral,  superficial femoral occlusion and left superficial femoral artery  occlusion.  He has not felt to be a good candidate for right above-knee  amputation not only because of the ischemia, but due to do the presence  of the femur fracture with plate fixation.   The patient and his daughter tell me that he is attempting  rehabilitation at [facility].  He is still not independent  in transfers.  I think he still needs to be hoyer lifted.  He has  developed a dry gangrene over the right below-knee amputation site.  He  does not complain of pain, fever, or excessive drainage.   PAST MEDICAL HISTORY:  1. Right hip arthroplasty.  2. Right inguinal hernia repair.  3. Right knee meniscal operations.  4. Right rotator cuff surgery.  5. Right knee femur fracture on January 13, 2008, with open reduction  and      internal fixation.  6. Right below-knee amputation on January 29, 2008.   SOCIAL HISTORY:  He is a widower and he lives independently.  He has a  remote history of tobacco abuse that was stopped many years ago.  No  history of alcohol abuse.  He has 2 children.   PHYSICAL EXAMINATION:  VITAL SIGNS:  Temperature is 97.8, pulse 75,  respirations 20, and blood pressure 87/48.  GENERAL:  Although the patient is asymptomatic.  RESPIRATORY:  Revealed clear entry bilaterally.  CARDIAC:  Heart sounds were normal.  There was no murmurs.  No carotid  bruits.  No signs of congestive heart failure.  ABDOMEN:  Soft.  There is no liver, no spleen, no tenderness.  EXTREMITIES:  Circulation, peripheral pulses are very difficult to feel  even in the left leg.  The right stump is covered with a large adherent  black eschar, which is only open in the small part at roughly 4 o'clock.  There is some surrounding erythema, although it does not have the  suggestion of cellulitis.  I cannot feel a popliteal pulse on the right  side.   IMPRESSION:  1. Status post right below-knee amputation with now a dry gangrene at      that site.  2. Probable severe peripheral vascular disease, which is apparently      not a reconstructable situation.  3. Type 2 diabetes mellitus.   I think this gentleman is a candidate for hyperbaric oxygen to see if we  can get some possible healing of the right below-knee amputation site.  The black eschar should be allowed to separate off on its own.  This  seems to be the plan here.  The major problem that we had in looking at  this with the family and the [facility] the [facility] is simple  logistics.  Transportations from the nursing home 5 days a week would be  difficult and then there was whether this would be paid for under  Medicare.  Our staff is going to look into this as well as the patient's  daughter.  If this can be arranged I think he would be a  reasonable  candidate.  We did attempt to do TCOM's on him; however, there were  problems with the machine and therefore this could not be accomplished.   I did not make firm arrangements for him to come back to the clinic;  however, if indeed we can overcome logistical obstacles of transporting  him frequently on a daily basis to the [facility] then we will  arrange hyperbaric oxygen therapy.           ______________________________  Maxwell Caul, M.D.     MGR/MEDQ  D:  03/16/2008  T:  03/17/2008  Job:  [id number]   cc:   Ivan [name]. Ivan [name], M.D.

## 2010-12-09 NOTE — Op Note (Signed)
[name], Ivan [name]                  ACCOUNT NO.:  [id number]   MEDICAL RECORD NO.:  [id number]          PATIENT TYPE:  AMB   LOCATION:  SDS                          FACILITY:  [facility]   PHYSICIAN:  Ines Bloomer, M.D. DATE OF BIRTH:  [birth year]   DATE OF PROCEDURE:  01/18/2009  DATE OF DISCHARGE:                               OPERATIVE REPORT   PREOPERATIVE DIAGNOSIS:  Foreign body, right lower lobe.   POSTOPERATIVE DIAGNOSIS:  Foreign body, right lower lobe.   OPERATION PERFORMED:  Video bronchoscopy, removal of foreign body.   After general anesthesia, the video bronchoscope was passed.  This  patient had aspirated a peanut 2 months ago and forgotten about it, and  then developed recurrent pneumonia and fever in the right middle lobe  and the right lower lobe.  One week ago he had a bronchoscopy, which  showed a lesion in the right lower lobe which was biopsied and came back  inflammatory tissue with some questionable vegetable matter.  It was  then that the patient volunteered that he had aspirated peanut two to  three months ago.  He was brought back to the operating room for foreign  body removal.  After general anesthesia again, video bronchoscope was  passed.  The left main stem, left upper lobe, and left lower lobe  orifices were normal.  The right main stem and right upper lobe orifices  were normal.  Passing down, you could see the right middle lobe, and  that orifice appeared to be normal; however, in the basilar segment to  the right lower lobe, there was almost complete obstruction with the  whitish foreign body, which was a peanut.  We then started debriding the  peanut with the 2.8 forceps.  We tried to dislodge it using #4 and #5  Fogarty catheter, passing the balloon distally and pulling it back.  We  were able finally to free the peanut up, after we had done much more  debridement with the 2.8 biopsy forceps, we really removed about half of  that with the biopsy  forceps and then was able to free the rest of the  foreign body up from the granulation tissue and was able to bring it  back into the bronchus intermedius where passed a basket through the  scope and captured the residual portion of the foreign body and then  removed it through the endotracheal tube pulling that with the scope.  We then went back and irrigated copiously.  We sent cultures of the  fluid, and the patient was then returned to the recovery room in stable  condition.      Ines Bloomer, M.D.  Electronically Signed     DPB/MEDQ  D:  01/18/2009  T:  01/19/2009  Job:  [id number]

## 2010-12-09 NOTE — Discharge Summary (Signed)
[name], [name] NO.:  [id number]   MEDICAL RECORD NO.:  [id number]          PATIENT TYPE:  INP   LOCATION:  1602                         FACILITY:  [facility]   PHYSICIAN:  Georges Lynch. Gioffre, M.D.DATE OF BIRTH:  Jan 09, 1927   DATE OF ADMISSION:  01/29/2008  DATE OF DISCHARGE:  02/03/2008                               DISCHARGE SUMMARY   ADMISSION DIAGNOSES:  1. Dry gangrene right lower extremity.  2. Rule out infection of right hip wound.  3. Postop open reduction internal fixation of right periprosthetic      fracture of the right hip.  4. Hypertension.  5. Hypercholesterolemia.  6. Supratherapeutic of Coumadin therapy, admission INR 7.7.  7. Diabetes.   DISCHARGE DIAGNOSES:  1. 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  2. Superficial infection of his right hip wound, improved.  3. Correction of supratherapeutic Coumadin treatment on admission INR      7.7 with vitamin K and 1 unit have fresh frozen plasma.  4. Postoperative elevated Coumadin INR therapy with very sensitive      patient.  5. Diabetes.  6. Postoperative blood loss, vital signs stable.  No blood transfusion      required.  7. Hypertension.  8. Hypercholesterolemia.  9. Healing right hip periprosthetic fracture with open reduction and      internal fixation.   HISTORY OF PRESENT ILLNESS:  Ivan [name] is a 77 year old gentleman well-  known to the practice.  He had a ORIF of his right hip periprosthetic  fracture by Dr. Darrelyn Hillock in June.  At presentation to the ER, the patient  was found to have a gangrenous stump of toe.  The tip of the toe had  just self-amputated prior to admission to the hospital.  The patient had  an ORIF and debridement of his right hip fracture.  He also had  debridement of his right great toe.  The patient was transferred to a  skilled nursing facility where he was followed and he was having  postoperative checks as an outpatient.   He was found to have  progressively worsening gangrenous-appearing toes.  Patient was brought  back to the hospital with temperatures and evaluation revealed that he  had significant progression of the dry gangrenous changes throughout the  right lower extremity.  The patient also had a very slight infectious  type discharge from his right ORIF hip wound.  The patient was found to  have an INR of 7.7 and patient was admitted for BKA of his right lower  extremity and debridement of his right hip wound with antibiotic  treatments and correction of his supratherapeutic INR.   MEDICATIONS ON ADMISSION:  1. Actos  2. Benazepril  3. Chlordiazepoxide hydrochloride.  4. Pravachol  5. Colace  6. Coumadin  7. Ferrous sulfate  8. MiraLax   SURGICAL PROCEDURES:  On December 30, 2007, the patient was admitted into  [facility] and then transferred to [facility].  The patient was taken to the OR at  [facility] by  Dr. Ranee Gosselin for a right BKA.  It was done under general  anesthesia.  The patient tolerated the procedure well.  There were no  complications.  The patient also had an uncomplicated debridement of his  right hip wound which appeared to be very superficial and stitch abscess  in nature.  Both wounds were dressed surly postoperatively.  The patient  was transferred to the recovery room and then to the orthopedic floor in  good condition.   CONSULTANTS:  Following routine consults requested:  1. Physical therapy.  2. Case management.  3. Pharmacy for Coumadin dosing.   HOSPITAL COURSE:  On January 29, 2008, the patient presented to [facility]  [facility] and evaluated in the ER, found to have a  gangrenous right lower extremity with possible superficial infection of  his right ORIF wound of his hip.  The patient, due to the surgical  schedules, was transferred over to [facility] for surgical  procedure.Marland Kitchen  He was found to  have a INR 7.7.  He was given 10 mg of  vitamin K IV and that was followed by 1 unit of FFP and his INR did drop  down to acceptable levels of 1.9.  The patient was taken to the OR where  the right lower extremity BKA was performed without any complications.  His right hip wound was explored.  It was found to be a very superficial  stitch abscess.  Cultures were taken and they were negative.  The  patient's wounds were sterilely cleaned and dressed.  The patient was  transferred to the recovery room and then to the orthopedic floor in  good condition on IV vancomycin.  He was also going to restart his  postoperative Coumadin therapy for DVT prophylaxis.   The patient then incurred 4 days postoperative course on the orthopedic  floor in which the patient had his INR slowly creep up.  On the day of  discharge his INR was 4.4.  His Coumadin had been held for 24 hours  previously with the previous last dose given 2.5.  The patient was felt  to be very susceptible to Coumadin and he would be managed as an  outpatient at a very low dose just keep him above slightly therapeutic  levels.   The patient's right lower extremity BKA stump wound was very sensitive.  He had one area in the central dorsal flap that was significantly  ecchymotic in appearance but there was no signs of infection and the  wound continued to appear viable throughout his hospitalization.  The  patient did develop some pressure sores on the dorsal aspect of his  patella, but they did not develop any signs of infection and they did  not advance during his hospitalization.  The patient's pain was well  controlled with Darvocet.  It was felt that the patient was ready for  discharge back to skilled nursing facility from which he came for  continued outpatient care with follow-up with Dr. Jeannetta Ellis office  frequently for close monitoring of his wound.   LABORATORY DATA:  CBC on admission found WBCs 12.5, hemoglobin 11.2,   hematocrit 33.3 with 444 platelets.  The patient's lowest hemoglobin  level was on February 01, 2008, and was 9.3/27.4.  His white count did drop  and improved.  The patient's CBC on date of discharge found WBCs 10.8,  hemoglobin 9.9, hematocrit 29.2, platelets 403.  The patient's INR on  admission was 7.7, preoperatively it dropped to 1.9 with an FFP and 10  mg vitamin K.  On date of discharge his INR was 4.4 with the last dose  given 48 hours previous at 2.5.  Routine chemistries on day of discharge  found sodium 136, potassium of 4.4, BUN was 14 with a creatinine of 0.56  with a glucose 95.  Pathology report on his right below-the-knee  amputation shows ulceration with necrosis consistent with ischemic  necrosis, severe calcified atherosclerosis.  Patient received 1 unit of  FFP.   EKG on admission found of wide QRS rhythm, left axis deviation at 74  beats per minute.   DISCHARGE INSTRUCTIONS:  1. DIET:  The patient is to maintain a 2,000 calories ADA diet.  2. WOUND CARE:  The patient is to have his wound dressing changed on a      daily basis.  He is to have 4x4s and ABD applied with very light      pressure.  He is to have the compression stocking pulled up over to      hold the dressings in place with the opening over his patella to      keep pressure off of his patella.  His right lateral hip wounds are      to have been clean dressings on them on a daily basis.  3. ACTIVITY:  The patient is to maintain bedrest.  He may go bed to      chair transfers with assistance.   FOLLOW UP:  The patient needs a follow-up appointment with Dr. Jeannetta Ellis  office the following Tuesday, February 07, 2008.  Skilled nursing facility  is to call [phone] for that follow-up appointment with Dr. Nicola Girt. for a wound check.   MEDICATIONS:  1. Coumadin is to be held until INR is below 2 and then Coumadin      dosing of no greater than 2.5 mg a day is to be provided and his      INR is to be  frequently checked to keep his INR at the 2 level.      Warning to the skilled nursing facility, this patient is very      sensitive to Coumadin and can become supratherapeutic very quickly.  2. Colace 100 mg twice a day.  3. Ferrous sulfate 325 mg three times a day.  4. Zocor 20 mg a day.  5. Actos 15 mg a day.  6. Lotensin 40 mg a day.  7. Glucophage 850 mg a day.  8. Librium 10 mg nightly.  9. MiraLax 17 grams daily.  10.The patient is to be managed on an insulin sliding scale at      moderate sensitivity.  11.Nystatin powder to be applied to groin three times a day.  12.Phenergan 12.5-25 mg p.o. q.6 h p.r.n.  13.Robaxin 500 mg p.o. q.6 h p.r.n.  14.Majik mouthwash 5 mL p.o. q.i.d. p.r.n.  15.Darvocet-N 100 one or two tablets every 4-6 hours p.r.n. pain.  16.Ambien 5 mg p.o. nightly p.r.n.   CAUSE OF DEATH:  The patient's condition upon discharge to skilled  nursing facility is listed as improved and good.      Jamelle Rushing, P.A.    ______________________________  Georges Lynch Darrelyn Hillock, M.D.    RWK/MEDQ  D:  02/03/2008  T:  02/03/2008  Job:  [id number]

## 2010-12-09 NOTE — Letter (Signed)
January 02, 2009   Gloriajean Dell. Andrey Campanile, MD  [address]  [place], [place] [address]   Re:  Ivan [name], OHALLORAN                  DOB:  04-May-1927   Dear Dr. Andrey Campanile:   This is an 77 year old Caucasian male, who has multiple medical problems  and is currently in [facility] in  [place], [place].  On chest x-ray, he was found to have a  right mid lung infiltrate and underwent a CT scan at [facility], which was  read as infiltrates and the obstructive changes in the bronchus and  probably the right middle lobe, although the radiologist thought it was  the right lower lobe segment and also some other mild airspace  capacities, a small right pleural effusion.  He also has degenerative  arthritis and the patient has multiple problems.  He had a right AKA.  He has got severe diabetes mellitus.  He has had depression,  arrhythmias, hypertension, and peripheral vascular disease as well as  BPH.  His medications include Avodart, Flomax, fluoxetine, aspirin,  Avandia, NovoLog, Patanol 0.1 eye drops, MiraLax, vitamin D, clonazepam,  and Lortab.  He is allergic to penicillin.  He also has hypertension and  hypercholesterolemia.   FAMILY HISTORY:  Noncontributory.   SOCIAL HISTORY:  He is a widower, has 7 children.  His daughter is his  power of attorney.  He quit smoking 17-18 years ago.  Does not drink  alcohol on a regular basis.   REVIEW OF SYSTEMS:  He has had some recent weight gain.  Cardiac:  He  gets shortness of breath with exertion.  He is on oxygen.  He has had  chronic cough.  No hemoptysis.  GI:  No nausea, vomiting, constipation,  or diarrhea.  GU:  No kidney disease, but has BPH, frequent urination.  Vascular:  He has peripheral vascular disease with a right AKA.  No TIAs  or DVTs.  Neurological:  No dizziness, headaches, blackouts, or  seizures.  Musculoskeletal:  No arthritis or joint pain.  Psychiatric:  Depression.  Eyes/ENT:  No changes in eyesight or  hearing.  Hematological:  No problems with bleeding or clotting disorders.   PHYSICAL EXAMINATION:  GENERAL:  He is a slightly obese Caucasian male  with a right AKA.  VITAL SIGNS:  His blood pressure is 126/70, pulse 76, respirations 18,  and saturations were 90%.  He says his sats have gone as low as 80%.  HEAD, EYES, EARS, NOSE, AND THROAT:  Unremarkable.  NECK:  Supple without thyromegaly.  There is no supraclavicular or  axillary adenopathy.  CHEST:  Clear to auscultation and percussion.  HEART:  Regular sinus rhythm.  No murmurs.  ABDOMEN:  Soft.  Bowel sounds are normal.  EXTREMITIES:  There is a right AKA, 1+ pulses.  No edema in the left  leg.  NEUROLOGICAL:  He is oriented x3.  He has decreased hearing.  Sensory  and motor intact.   I have looked at the CT scan and the chest x-ray, and I do agree he  needs a bronchoscopy to rule out some type of intraluminal problems such  as a cancer.  I did not see a definite mass, but definitely he has  evidence of obstruction in probably the right middle lobe.  We plan to  do this on January 10, 2009, at [facility].  I appreciate the  opportunity of seeing  the patient.   Sincerely,   Ines Bloomer, M.D.  Electronically Signed   DPB/MEDQ  D:  01/02/2009  T:  01/03/2009  Job:  [id number]

## 2011-04-23 LAB — CBC
HCT: 24.8 — ABNORMAL LOW
HCT: 38.8 — ABNORMAL LOW
HCT: 29.2 — ABNORMAL LOW
HCT: 33.3 — ABNORMAL LOW
Hemoglobin: 13
Hemoglobin: 8.3 — ABNORMAL LOW
Hemoglobin: 9.4 — ABNORMAL LOW
Hemoglobin: 9.6 — ABNORMAL LOW
Hemoglobin: 10 — ABNORMAL LOW
Hemoglobin: 11.2 — ABNORMAL LOW
Hemoglobin: 9.3 — ABNORMAL LOW
Hemoglobin: 9.9 — ABNORMAL LOW
MCHC: 33.5
MCHC: 33.5
MCHC: 33.7
MCHC: 33.8
MCHC: 33.4
MCHC: 33.8
MCV: 89.5
MCV: 89.6
MCV: 89.9
MCV: 86
MCV: 86.1
Platelets: 175
Platelets: 228
Platelets: 386
Platelets: 406 — ABNORMAL HIGH
Platelets: 419 — ABNORMAL HIGH
RBC: 2.76 — ABNORMAL LOW
RBC: 3.12 — ABNORMAL LOW
RBC: 3.17 — ABNORMAL LOW
RBC: 3.82 — ABNORMAL LOW
RBC: 4.32
RBC: 3.17 — ABNORMAL LOW
RBC: 3.17 — ABNORMAL LOW
RBC: 3.39 — ABNORMAL LOW
RBC: 3.81 — ABNORMAL LOW
RDW: 15.7 — ABNORMAL HIGH
RDW: 16 — ABNORMAL HIGH
RDW: 16.1 — ABNORMAL HIGH
RDW: 15.8 — ABNORMAL HIGH
RDW: 16 — ABNORMAL HIGH
WBC: 11.9 — ABNORMAL HIGH
WBC: 6.4
WBC: 7.2
WBC: 7.9
WBC: 10.4
WBC: 10.8 — ABNORMAL HIGH
WBC: 12.2 — ABNORMAL HIGH
WBC: 9.1

## 2011-04-23 LAB — COMPREHENSIVE METABOLIC PANEL WITH GFR
AST: 24
BUN: 7
CO2: 25
Chloride: 101
Creatinine, Ser: 0.7
GFR calc Af Amer: >60
GFR calc non Af Amer: >60
Total Bilirubin: 0.7

## 2011-04-23 LAB — BASIC METABOLIC PANEL
BUN: 10
BUN: 10
BUN: 11
BUN: 13
CO2: 22
CO2: 30
Calcium: 8 — ABNORMAL LOW
Calcium: 8.2 — ABNORMAL LOW
Calcium: 8.6
Calcium: 8.9
Calcium: 9
Chloride: 99
Chloride: 100
Chloride: 101
Chloride: 98
Chloride: 99
Creatinine, Ser: 0.57
Creatinine, Ser: 0.51
Creatinine, Ser: 0.56
Creatinine, Ser: 0.57
Creatinine, Ser: 0.62
Creatinine, Ser: 0.68
GFR calc Af Amer: >60
GFR calc Af Amer: >60
GFR calc Af Amer: >60
GFR calc Af Amer: >60
GFR calc Af Amer: >60
GFR calc Af Amer: >60
GFR calc Af Amer: >60
GFR calc non Af Amer: >60
GFR calc non Af Amer: >60
GFR calc non Af Amer: >60
GFR calc non Af Amer: >60
Potassium: 4.4
Potassium: 4.4
Sodium: 134 — ABNORMAL LOW
Sodium: 135
Sodium: 136

## 2011-04-23 LAB — COMPREHENSIVE METABOLIC PANEL
ALT: 17
AST: 59 — ABNORMAL HIGH
Albumin: 2.4 — ABNORMAL LOW
Albumin: 3.2 — ABNORMAL LOW
Alkaline Phosphatase: 116
Calcium: 8.5
Calcium: 8.7
Creatinine, Ser: 0.59
GFR calc Af Amer: >60
Glucose, Bld: 142 — ABNORMAL HIGH
Potassium: 3.7
Sodium: 136
Total Protein: 5.9 — ABNORMAL LOW
Total Protein: 7.1

## 2011-04-23 LAB — PROTIME-INR
INR: 1
INR: 1.1
INR: 1.3
INR: 1.9 — ABNORMAL HIGH
INR: 1.3
INR: 1.4
INR: 1.9 — ABNORMAL HIGH
INR: 2.3 — ABNORMAL HIGH
INR: 3.1 — ABNORMAL HIGH
INR: 3.7 — ABNORMAL HIGH
Prothrombin Time: 13
Prothrombin Time: 22 — ABNORMAL HIGH
Prothrombin Time: 16.9 — ABNORMAL HIGH
Prothrombin Time: 17.3 — ABNORMAL HIGH
Prothrombin Time: 22.1 — ABNORMAL HIGH
Prothrombin Time: 26 — ABNORMAL HIGH
Prothrombin Time: 33.5 — ABNORMAL HIGH
Prothrombin Time: 45.9 — ABNORMAL HIGH

## 2011-04-23 LAB — POCT I-STAT, CHEM 8
Chloride: 100
Creatinine, Ser: 0.8
Glucose, Bld: 144 — ABNORMAL HIGH
HCT: 36 — ABNORMAL LOW
Hemoglobin: 12.2 — ABNORMAL LOW
Potassium: 5.1
Sodium: 133 — ABNORMAL LOW

## 2011-04-23 LAB — WOUND CULTURE
Culture: NO GROWTH
Gram Stain: NONE SEEN
Gram Stain: NONE SEEN

## 2011-04-23 LAB — CROSSMATCH
ABO/RH(D): O NEG
ABO/RH(D): O NEG
ABO/RH(D): O NEG
Antibody Screen: NEGATIVE

## 2011-04-23 LAB — DIFFERENTIAL
Basophils Absolute: 0
Basophils Absolute: 0
Basophils Absolute: 0
Basophils Relative: 0
Basophils Relative: 0
Eosinophils Absolute: 0.1
Eosinophils Relative: 1
Eosinophils Relative: 1
Lymphocytes Relative: 11 — ABNORMAL LOW
Lymphocytes Relative: 21
Lymphocytes Relative: 10 — ABNORMAL LOW
Lymphs Abs: 1.4
Lymphs Abs: 1.2
Monocytes Absolute: 0.7
Monocytes Absolute: 1.2 — ABNORMAL HIGH
Monocytes Relative: 6
Monocytes Relative: 8
Neutro Abs: 5.6
Neutro Abs: 9.8 — ABNORMAL HIGH
Neutro Abs: 9.7 — ABNORMAL HIGH
Neutrophils Relative %: 70
Neutrophils Relative %: 82 — ABNORMAL HIGH

## 2011-04-23 LAB — APTT: aPTT: 32

## 2011-04-23 LAB — CULTURE, BLOOD (ROUTINE X 2)
Culture: NO GROWTH
Culture: NO GROWTH

## 2011-04-23 LAB — URINALYSIS, ROUTINE W REFLEX MICROSCOPIC
Bilirubin Urine: NEGATIVE
Glucose, UA: NEGATIVE
Ketones, ur: NEGATIVE
Leukocytes, UA: NEGATIVE
Nitrite: NEGATIVE
Protein, ur: 30 — AB
Specific Gravity, Urine: 1.009
Urobilinogen, UA: 0.2
pH: 5.5

## 2011-04-23 LAB — PREPARE FRESH FROZEN PLASMA

## 2011-04-23 LAB — ANAEROBIC CULTURE

## 2011-04-23 LAB — LIPID PANEL
HDL: 43
Total CHOL/HDL Ratio: 3.4
Triglycerides: 118
VLDL: 24

## 2011-04-23 LAB — URINE MICROSCOPIC-ADD ON

## 2011-04-23 LAB — HEMOGLOBIN AND HEMATOCRIT, BLOOD
HCT: 30 — ABNORMAL LOW
Hemoglobin: 9.9 — ABNORMAL LOW

## 2011-04-23 LAB — TISSUE CULTURE: Gram Stain: NONE SEEN

## 2011-04-23 LAB — HEMOGLOBIN A1C: Mean Plasma Glucose: 136

## 2011-04-24 LAB — DIFFERENTIAL
Eosinophils Relative: 1
Lymphocytes Relative: 20
Lymphs Abs: 2
Monocytes Absolute: 1.1 — ABNORMAL HIGH
Monocytes Relative: 11

## 2011-04-24 LAB — PROTIME-INR
INR: 1.1
INR: 1.3
INR: 1.4
INR: 1.8 — ABNORMAL HIGH
Prothrombin Time: 16.8 — ABNORMAL HIGH
Prothrombin Time: 22 — ABNORMAL HIGH

## 2011-04-24 LAB — CULTURE, BLOOD (ROUTINE X 2)

## 2011-04-24 LAB — CROSSMATCH
ABO/RH(D): O NEG
Antibody Screen: NEGATIVE

## 2011-04-24 LAB — COMPREHENSIVE METABOLIC PANEL
AST: 87 — ABNORMAL HIGH
Albumin: 2.5 — ABNORMAL LOW
Calcium: 9.3
Chloride: 98
Creatinine, Ser: 1.6 — ABNORMAL HIGH
GFR calc Af Amer: 51 — ABNORMAL LOW

## 2011-04-24 LAB — CBC
HCT: 24.3 — ABNORMAL LOW
HCT: 26.2 — ABNORMAL LOW
HCT: 34.6 — ABNORMAL LOW
Hemoglobin: 8.5 — ABNORMAL LOW
MCHC: 33.2
MCHC: 34.8
MCHC: 34.9
MCV: 85.7
MCV: 85.8
Platelets: 269
Platelets: 304
Platelets: 321
Platelets: 337
RBC: 2.83 — ABNORMAL LOW
RBC: 2.9 — ABNORMAL LOW
RBC: 3.03 — ABNORMAL LOW
RDW: 15.6 — ABNORMAL HIGH
WBC: 10.1
WBC: 11 — ABNORMAL HIGH
WBC: 6.9
WBC: 7.8

## 2011-04-24 LAB — BASIC METABOLIC PANEL
BUN: 5 — ABNORMAL LOW
CO2: 25
Calcium: 8.7
Calcium: 9
Calcium: 9.1
Chloride: 103
Chloride: 106
Creatinine, Ser: 0.38 — ABNORMAL LOW
Creatinine, Ser: 1.12
GFR calc Af Amer: >60
GFR calc Af Amer: >60
GFR calc Af Amer: >60
GFR calc Af Amer: >60
GFR calc non Af Amer: >60
Potassium: 3.8
Potassium: 4.6
Sodium: 135

## 2011-04-24 LAB — URINALYSIS, ROUTINE W REFLEX MICROSCOPIC
Specific Gravity, Urine: 1.02
Urobilinogen, UA: 1

## 2011-04-24 LAB — URINE CULTURE: Culture: NO GROWTH

## 2011-04-24 LAB — TROPONIN I: Troponin I: 0.02

## 2011-04-24 LAB — URINE MICROSCOPIC-ADD ON

## 2011-04-24 LAB — POCT CARDIAC MARKERS
Myoglobin, poc: >500
Operator id: 133351
Troponin i, poc: <0.05

## 2011-04-24 LAB — ABO/RH: ABO/RH(D): O NEG

## 2011-04-24 LAB — MAGNESIUM: Magnesium: 1.6

## 2011-04-27 LAB — GLUCOSE, CAPILLARY
Glucose-Capillary: 103 — ABNORMAL HIGH
Glucose-Capillary: 110 — ABNORMAL HIGH
Glucose-Capillary: 117 — ABNORMAL HIGH
Glucose-Capillary: 121 — ABNORMAL HIGH
Glucose-Capillary: 106 — ABNORMAL HIGH

## 2011-04-27 LAB — PROTIME-INR
INR: 1.1
Prothrombin Time: 14.4
Prothrombin Time: 15.4 — ABNORMAL HIGH

## 2011-04-27 LAB — BASIC METABOLIC PANEL
CO2: 27
Calcium: 8.9
Creatinine, Ser: 0.68
Glucose, Bld: 136 — ABNORMAL HIGH

## 2011-04-27 LAB — CBC
HCT: 27.7 — ABNORMAL LOW
Hemoglobin: 9.2 — ABNORMAL LOW
MCHC: 33.4
MCV: 87.3
MCV: 88
Platelets: 236
Platelets: 197
RBC: 3.15 — ABNORMAL LOW
RDW: 17 — ABNORMAL HIGH
RDW: 17.1 — ABNORMAL HIGH
WBC: 8.9
WBC: 7

## 2011-04-27 LAB — APTT: aPTT: 34

## 2011-04-27 LAB — HEMOGLOBIN AND HEMATOCRIT, BLOOD
HCT: 33.8 — ABNORMAL LOW
Hemoglobin: 11.3 — ABNORMAL LOW

## 2011-04-27 LAB — COMPREHENSIVE METABOLIC PANEL
AST: 20
Albumin: 3.3 — ABNORMAL LOW
Chloride: 101
Creatinine, Ser: 0.61
GFR calc Af Amer: >60
Total Bilirubin: 0.6
Total Protein: 7.1

## 2011-12-03 ENCOUNTER — Emergency Department (HOSPITAL_COMMUNITY): Payer: PRIVATE HEALTH INSURANCE

## 2011-12-03 ENCOUNTER — Inpatient Hospital Stay (HOSPITAL_COMMUNITY)
Admission: EM | Admit: 2011-12-03 | Discharge: 2011-12-05 | DRG: 243 | Disposition: A | Discharge status: Skilled Nursing Facility | Payer: PRIVATE HEALTH INSURANCE | Attending: Interventional Cardiology | Admitting: Interventional Cardiology

## 2011-12-03 ENCOUNTER — Encounter (HOSPITAL_COMMUNITY): Payer: Self-pay | Admitting: Emergency Medicine

## 2011-12-03 DIAGNOSIS — I452 Bifascicular block: Secondary | ICD-10-CM | POA: Diagnosis present

## 2011-12-03 DIAGNOSIS — E669 Obesity, unspecified: Secondary | ICD-10-CM | POA: Diagnosis present

## 2011-12-03 DIAGNOSIS — D509 Iron deficiency anemia, unspecified: Secondary | ICD-10-CM | POA: Diagnosis present

## 2011-12-03 DIAGNOSIS — Z7982 Long term (current) use of aspirin: Secondary | ICD-10-CM

## 2011-12-03 DIAGNOSIS — Z794 Long term (current) use of insulin: Secondary | ICD-10-CM

## 2011-12-03 DIAGNOSIS — S78119A Complete traumatic amputation at level between unspecified hip and knee, initial encounter: Secondary | ICD-10-CM

## 2011-12-03 DIAGNOSIS — R339 Retention of urine, unspecified: Secondary | ICD-10-CM | POA: Diagnosis not present

## 2011-12-03 DIAGNOSIS — E119 Type 2 diabetes mellitus without complications: Secondary | ICD-10-CM | POA: Diagnosis present

## 2011-12-03 DIAGNOSIS — N138 Other obstructive and reflux uropathy: Secondary | ICD-10-CM | POA: Diagnosis present

## 2011-12-03 DIAGNOSIS — N401 Enlarged prostate with lower urinary tract symptoms: Secondary | ICD-10-CM | POA: Diagnosis present

## 2011-12-03 DIAGNOSIS — Z950 Presence of cardiac pacemaker: Secondary | ICD-10-CM | POA: Diagnosis not present

## 2011-12-03 DIAGNOSIS — E785 Hyperlipidemia, unspecified: Secondary | ICD-10-CM | POA: Diagnosis present

## 2011-12-03 DIAGNOSIS — R001 Bradycardia, unspecified: Secondary | ICD-10-CM

## 2011-12-03 DIAGNOSIS — R531 Weakness: Secondary | ICD-10-CM | POA: Diagnosis present

## 2011-12-03 DIAGNOSIS — Z880 Allergy status to penicillin: Secondary | ICD-10-CM

## 2011-12-03 DIAGNOSIS — I441 Atrioventricular block, second degree: Secondary | ICD-10-CM | POA: Diagnosis present

## 2011-12-03 DIAGNOSIS — D649 Anemia, unspecified: Secondary | ICD-10-CM

## 2011-12-03 DIAGNOSIS — Z79899 Other long term (current) drug therapy: Secondary | ICD-10-CM

## 2011-12-03 DIAGNOSIS — I5030 Unspecified diastolic (congestive) heart failure: Secondary | ICD-10-CM | POA: Diagnosis present

## 2011-12-03 DIAGNOSIS — I509 Heart failure, unspecified: Secondary | ICD-10-CM | POA: Diagnosis present

## 2011-12-03 DIAGNOSIS — I498 Other specified cardiac arrhythmias: Principal | ICD-10-CM | POA: Diagnosis present

## 2011-12-03 HISTORY — DX: Anemia, unspecified: D64.9

## 2011-12-03 HISTORY — DX: Unspecified osteoarthritis, unspecified site: M19.90

## 2011-12-03 HISTORY — DX: Peripheral vascular disease, unspecified: I73.9

## 2011-12-03 LAB — DIFFERENTIAL
Basophils Absolute: 0.1 10*3/uL (ref 0.0–0.1)
Basophils Relative: 1 % (ref 0–1)
Monocytes Relative: 10 % (ref 3–12)
Neutro Abs: 4.5 10*3/uL (ref 1.7–7.7)
Neutrophils Relative %: 68 % (ref 43–77)

## 2011-12-03 LAB — BASIC METABOLIC PANEL
BUN: 13 mg/dL (ref 6–23)
Chloride: 103 mEq/L (ref 96–112)
GFR calc Af Amer: >90 mL/min (ref >90)
Potassium: 4.5 mEq/L (ref 3.5–5.1)

## 2011-12-03 LAB — URINE MICROSCOPIC-ADD ON

## 2011-12-03 LAB — URINALYSIS, ROUTINE W REFLEX MICROSCOPIC
Leukocytes, UA: NEGATIVE
Nitrite: POSITIVE — AB
Specific Gravity, Urine: 1.015 (ref 1.005–1.030)
pH: 5.5 (ref 5.0–8.0)

## 2011-12-03 LAB — POCT I-STAT TROPONIN I: Troponin i, poc: 0 ng/mL (ref 0.00–0.08)

## 2011-12-03 LAB — CBC
Hemoglobin: 8.9 g/dL — ABNORMAL LOW (ref 13.0–17.0)
MCHC: 29.6 g/dL — ABNORMAL LOW (ref 30.0–36.0)

## 2011-12-03 MED ORDER — SODIUM CHLORIDE 0.9 % IV SOLN
INTRAVENOUS | Status: DC
  Administered 2011-12-03: 20:00:00 via INTRAVENOUS

## 2011-12-03 NOTE — ED Notes (Signed)
MD at bedside. Dr. Caporossi at bedside.  

## 2011-12-03 NOTE — ED Notes (Signed)
NWG:[id number]> Expected date:12/03/11<BR> Expected time:<BR> Means of arrival:<BR> Comments:<BR> EMS 15 gC - fluid retention

## 2011-12-03 NOTE — ED Notes (Signed)
To ed via [facility] from [facility] with c/o weakness, pitting edema in left leg, hx right AKA, alert/oriented x 3, grand daughters at bedside

## 2011-12-03 NOTE — ED Provider Notes (Addendum)
History     CSN: [id number]  Arrival date & time 12/03/11  1845   First MD Initiated Contact with Patient 12/03/11 1855      Chief Complaint  Patient presents with  . Weakness    (Consider location/radiation/quality/duration/timing/severity/associated sxs/prior treatment) Patient is a 78 y.o. male presenting with weakness. The history is provided by the patient and the spouse.  Weakness The primary symptoms include nausea. Primary symptoms do not include fever or vomiting.  Additional symptoms include weakness.   the patient is an 78 year old, male, with a history of diabetes, who presents to emergency department stating that he does not feel well.  For the past week.  He has had nausea without vomiting.  She denies pain anywhere.  He denies chest pain, cough, or shortness of breath.  He denies diarrhea, fevers, or chills.  Presently, besides his generalized weakness.  He is asymptomatic.  He does not smoke cigarettes.  He has not had a history of an irregular heartbeat or myocardial infarction.  He denies prior stroke.  He denies prior COPD.  No past medical history on file.  No past surgical history on file.  No family history on file.  History  Substance Use Topics  . Smoking status: Not on file  . Smokeless tobacco: Not on file  . Alcohol Use: Not on file      Review of Systems  Constitutional: Negative for fever and chills.  Eyes: Negative for visual disturbance.  Respiratory: Negative for cough, chest tightness and shortness of breath.   Cardiovascular: Negative for chest pain.  Gastrointestinal: Positive for nausea. Negative for vomiting, abdominal pain and diarrhea.  Genitourinary: Negative for dysuria and frequency.  Musculoskeletal: Negative for back pain.  Skin: Negative for rash.  Neurological: Positive for weakness. Negative for facial asymmetry.  Psychiatric/Behavioral: Negative for confusion.  All other systems reviewed and are negative.    Allergies    Atorvastatin; Penicillins; and Simvastatin  Home Medications   Current Outpatient Rx  Name Route Sig Dispense Refill  . ACETIC ACID-ALUMINUM ACETATE 2 % OT SOLN Both Ears Place 2 drops into both ears 2 (two) times daily as needed.    Marland Kitchen CETIRIZINE HCL 10 MG PO TABS Oral Take 10 mg by mouth daily.    . CHOLINE FENOFIBRATE 135 MG PO CPDR Oral Take 135 mg by mouth daily.    . DUTASTERIDE 0.5 MG PO CAPS Oral Take 0.5 mg by mouth daily.    Marland Kitchen FLUOXETINE HCL 10 MG PO CAPS Oral Take 10 mg by mouth daily.    Marland Kitchen FLUTICASONE PROPIONATE 50 MCG/ACT NA SUSP Nasal Place 2 sprays into the nose daily.    Marland Kitchen HYPROMELLOSE 2.5 % OP SOLN Both Eyes Place 1 drop into both eyes 3 (three) times daily as needed. For dry eyes    . INSULIN GLARGINE 100 UNIT/ML [place] SOLN Subcutaneous Inject 16 Units into the skin at bedtime.    . OLOPATADINE HCL 0.1 % OP SOLN Both Eyes Place 1 drop into both eyes 2 (two) times daily as needed.    Marland Kitchen POLYETHYL GLYCOL-PROPYL GLYCOL 0.4-0.3 % OP GEL Ophthalmic Apply 1 application to eye 4 (four) times daily as needed. For dry eyes    . POLYETHYLENE GLYCOL 3350 PO PACK Oral Take 17 g by mouth daily.    Marland Kitchen VITAMIN D (ERGOCALCIFEROL) 50000 UNITS PO CAPS Oral Take 50,000 Units by mouth every 30 (thirty) days.    Marland Kitchen ZOLPIDEM TARTRATE 5 MG PO TABS Oral Take 5  mg by mouth at bedtime as needed. For sleep      BP 166/43  Pulse 45  Temp(Src) 98.3 F (36.8 C) (Oral)  Resp 18  SpO2 92%  Physical Exam  Vitals reviewed. Constitutional: He is oriented to person, place, and time. He appears well-developed and well-nourished. No distress.  HENT:  Head: Normocephalic and atraumatic.  Eyes: Conjunctivae are normal.  Neck: Normal range of motion. Neck supple.  Cardiovascular:  Murmur heard.      Bradycardia  Pulmonary/Chest: Effort normal and breath sounds normal. No respiratory distress. He has no rales.  Abdominal: Soft. Bowel sounds are normal.  Genitourinary: Guaiac negative stool.   Musculoskeletal: He exhibits no edema and no tenderness.       Right AKA  Neurological: He is alert and oriented to person, place, and time.  Skin: Skin is warm and dry. There is pallor.  Psychiatric: He has a normal mood and affect. Thought content normal.    ED Course  Procedures (including critical care time) 78 year old, male, with diabetes.  Presents with weakness.  For the past week.  He has no other symptoms.  He has an significant bradycardia, which I think is a complete heart block.  However, his blood pressure is normal and he is in no distress.  We will perform laboratory testing.  Chest x-ray report has are given.  No medical intervention is indicated at this time.  We will complete his evaluation and then arrange admission.   Labs Reviewed  CBC  DIFFERENTIAL  BASIC METABOLIC PANEL  URINALYSIS, ROUTINE W REFLEX MICROSCOPIC   No results found.   No diagnosis found.  ECG Bradycardia with 2:1 heat block.   Hr 49 Left axis rbbb       he remains asx except generalized weakness with sbp 170s.  Discussed with Dr. Mayford Knife. He asked me to fax the ecgs.  He agrees with my assessment about hrt block. He thinks pt needs admission eval for possible pacer.  I spoke with dr. Jomarie Longs.  She will come admit and arrange transfer to [facility].       11:04 PM Dr. Mayford Knife called me back.  He said [facility] does not want to do admission.  He agreed to admit himself.  MDM  Weakness, and bradycardia but no hypotension 2:1 heart block. Anemia No gi bleed        Cheri Guppy, MD 12/03/11 2130  Cheri Guppy, MD 12/03/11 [phone]

## 2011-12-03 NOTE — ED Notes (Signed)
Pt sent from Dr. For evaluation of fluid retention and low hemoglobin, left arm paralysis chronic and recent pneumonia

## 2011-12-04 ENCOUNTER — Inpatient Hospital Stay (HOSPITAL_COMMUNITY): Payer: PRIVATE HEALTH INSURANCE

## 2011-12-04 ENCOUNTER — Encounter (HOSPITAL_COMMUNITY): Payer: Self-pay | Admitting: General Practice

## 2011-12-04 ENCOUNTER — Encounter (HOSPITAL_COMMUNITY)
Admission: EM | Disposition: A | Discharge status: Skilled Nursing Facility | Payer: Self-pay | Source: Home / Self Care | Attending: Interventional Cardiology

## 2011-12-04 DIAGNOSIS — R5381 Other malaise: Secondary | ICD-10-CM

## 2011-12-04 DIAGNOSIS — I441 Atrioventricular block, second degree: Secondary | ICD-10-CM | POA: Diagnosis present

## 2011-12-04 DIAGNOSIS — R5383 Other fatigue: Secondary | ICD-10-CM

## 2011-12-04 DIAGNOSIS — I498 Other specified cardiac arrhythmias: Principal | ICD-10-CM

## 2011-12-04 HISTORY — PX: PERMANENT PACEMAKER INSERTION: SHX5480

## 2011-12-04 LAB — CBC
HCT: 30 % — ABNORMAL LOW (ref 39.0–52.0)
Hemoglobin: 9 g/dL — ABNORMAL LOW (ref 13.0–17.0)
MCH: 22.8 pg — ABNORMAL LOW (ref 26.0–34.0)
MCHC: 30 g/dL (ref 30.0–36.0)
MCV: 76.1 fL — ABNORMAL LOW (ref 78.0–100.0)
Platelets: 238 10*3/uL (ref 150–400)
RBC: 3.94 MIL/uL — ABNORMAL LOW (ref 4.22–5.81)
RDW: 16.1 % — ABNORMAL HIGH (ref 11.5–15.5)
WBC: 7.3 10*3/uL (ref 4.0–10.5)

## 2011-12-04 LAB — MRSA PCR SCREENING: MRSA by PCR: NEGATIVE

## 2011-12-04 LAB — COMPREHENSIVE METABOLIC PANEL
ALT: 8 U/L (ref 0–53)
AST: 21 U/L (ref 0–37)
Albumin: 3.1 g/dL — ABNORMAL LOW (ref 3.5–5.2)
Alkaline Phosphatase: 39 U/L (ref 39–117)
BUN: 12 mg/dL (ref 6–23)
CO2: 27 mEq/L (ref 19–32)
Calcium: 9.3 mg/dL (ref 8.4–10.5)
Chloride: 103 mEq/L (ref 96–112)
Creatinine, Ser: 0.58 mg/dL (ref 0.50–1.35)
GFR calc Af Amer: >90 mL/min (ref >90)
GFR calc non Af Amer: >90 mL/min (ref >90)
Glucose, Bld: 112 mg/dL — ABNORMAL HIGH (ref 70–99)
Potassium: 4.1 mEq/L (ref 3.5–5.1)
Sodium: 139 mEq/L (ref 135–145)
Total Bilirubin: 0.4 mg/dL (ref 0.3–1.2)
Total Protein: 6.4 g/dL (ref 6.0–8.3)

## 2011-12-04 LAB — TSH: TSH: 2.694 u[IU]/mL (ref 0.350–4.500)

## 2011-12-04 LAB — PRO B NATRIURETIC PEPTIDE: Pro B Natriuretic peptide (BNP): 6095 pg/mL — ABNORMAL HIGH (ref 0–450)

## 2011-12-04 SURGERY — PERMANENT PACEMAKER INSERTION
Anesthesia: LOCAL

## 2011-12-04 MED ORDER — POLYETHYLENE GLYCOL 3350 17 G PO PACK
17.0000 g | PACK | Freq: Every day | ORAL | Status: DC
  Administered 2011-12-05: 17 g via ORAL
  Filled 2011-12-04 (×2): qty 1

## 2011-12-04 MED ORDER — PRO-STAT SUGAR FREE PO LIQD
30.0000 mL | Freq: Two times a day (BID) | ORAL | Status: DC
  Filled 2011-12-04 (×2): qty 30

## 2011-12-04 MED ORDER — ONDANSETRON HCL 4 MG/2ML IJ SOLN
INTRAMUSCULAR | Status: AC
  Administered 2011-12-04: 4 mg
  Filled 2011-12-04: qty 2

## 2011-12-04 MED ORDER — TAMSULOSIN HCL 0.4 MG PO CAPS
0.4000 mg | ORAL_CAPSULE | Freq: Every day | ORAL | Status: DC
  Administered 2011-12-05: 0.4 mg via ORAL
  Filled 2011-12-04 (×2): qty 1

## 2011-12-04 MED ORDER — GLUCERNA SHAKE PO LIQD: 237.0000 mL | Freq: Two times a day (BID) | ORAL | Status: DC

## 2011-12-04 MED ORDER — ZOLPIDEM TARTRATE 5 MG PO TABS
5.0000 mg | ORAL_TABLET | Freq: Every evening | ORAL | Status: DC | PRN
Start: 2011-12-04 — End: 2011-12-05
  Administered 2011-12-04: 5 mg via ORAL
  Filled 2011-12-04: qty 1

## 2011-12-04 MED ORDER — SODIUM CHLORIDE 0.9 % IV SOLN: 250.0000 mL | INTRAVENOUS | Status: DC

## 2011-12-04 MED ORDER — SODIUM CHLORIDE 0.45 % IV SOLN
INTRAVENOUS | Status: DC
  Administered 2011-12-04: 15:00:00 via INTRAVENOUS

## 2011-12-04 MED ORDER — LORATADINE 10 MG PO TABS
10.0000 mg | ORAL_TABLET | Freq: Every day | ORAL | Status: DC
  Administered 2011-12-04 – 2011-12-05 (×2): 10 mg via ORAL
  Filled 2011-12-04 (×2): qty 1

## 2011-12-04 MED ORDER — VANCOMYCIN HCL 1000 MG IV SOLR
1500.0000 mg | Freq: Two times a day (BID) | INTRAVENOUS | Status: AC
  Administered 2011-12-05: 1500 mg via INTRAVENOUS
  Filled 2011-12-04: qty 1500

## 2011-12-04 MED ORDER — DOCUSATE SODIUM 100 MG PO CAPS
100.0000 mg | ORAL_CAPSULE | Freq: Two times a day (BID) | ORAL | Status: DC
  Administered 2011-12-04 – 2011-12-05 (×3): 100 mg via ORAL
  Filled 2011-12-04 (×5): qty 1

## 2011-12-04 MED ORDER — BIOTENE DRY MOUTH MT LIQD
15.0000 mL | Freq: Two times a day (BID) | OROMUCOSAL | Status: DC
  Administered 2011-12-04 (×2): 15 mL via OROMUCOSAL

## 2011-12-04 MED ORDER — SODIUM CHLORIDE 0.9 % IJ SOLN: 3.0000 mL | Freq: Two times a day (BID) | INTRAMUSCULAR | Status: DC

## 2011-12-04 MED ORDER — CHLORHEXIDINE GLUCONATE 4 % EX LIQD
CUTANEOUS | Status: AC
  Filled 2011-12-04: qty 60

## 2011-12-04 MED ORDER — SODIUM CHLORIDE 0.9 % IJ SOLN
3.0000 mL | Freq: Two times a day (BID) | INTRAMUSCULAR | Status: DC
  Administered 2011-12-04 – 2011-12-05 (×3): 3 mL via INTRAVENOUS

## 2011-12-04 MED ORDER — LIDOCAINE HCL (PF) 1 % IJ SOLN
INTRAMUSCULAR | Status: AC
  Filled 2011-12-04: qty 60

## 2011-12-04 MED ORDER — FUROSEMIDE 10 MG/ML IJ SOLN
40.0000 mg | Freq: Once | INTRAMUSCULAR | Status: AC
  Administered 2011-12-04: 40 mg via INTRAVENOUS
  Filled 2011-12-04: qty 4

## 2011-12-04 MED ORDER — INSULIN ASPART 100 UNIT/ML ~~LOC~~ SOLN
0.0000 [IU] | Freq: Three times a day (TID) | SUBCUTANEOUS | Status: DC
  Administered 2011-12-05: 2 [IU] via SUBCUTANEOUS

## 2011-12-04 MED ORDER — SODIUM CHLORIDE 0.9 % IR SOLN
80.0000 mg | Status: DC
  Filled 2011-12-04 (×2): qty 2

## 2011-12-04 MED ORDER — ACETAMINOPHEN 325 MG PO TABS: 325.0000 mg | ORAL_TABLET | ORAL | Status: DC | PRN

## 2011-12-04 MED ORDER — FLUOXETINE HCL 10 MG PO CAPS
10.0000 mg | ORAL_CAPSULE | Freq: Every day | ORAL | Status: DC
  Administered 2011-12-04 – 2011-12-05 (×2): 10 mg via ORAL
  Filled 2011-12-04 (×2): qty 1

## 2011-12-04 MED ORDER — HEPARIN SODIUM (PORCINE) 5000 UNIT/ML IJ SOLN
5000.0000 [IU] | Freq: Three times a day (TID) | INTRAMUSCULAR | Status: DC
  Administered 2011-12-04 (×2): 5000 [IU] via SUBCUTANEOUS
  Filled 2011-12-04 (×4): qty 1

## 2011-12-04 MED ORDER — LIDOCAINE HCL (PF) 1 % IJ SOLN
INTRAMUSCULAR | Status: AC
  Filled 2011-12-04: qty 30

## 2011-12-04 MED ORDER — HEPARIN (PORCINE) IN NACL 2-0.9 UNIT/ML-% IJ SOLN
INTRAMUSCULAR | Status: AC
  Filled 2011-12-04: qty 1000

## 2011-12-04 MED ORDER — PANTOPRAZOLE SODIUM 40 MG PO TBEC
40.0000 mg | DELAYED_RELEASE_TABLET | Freq: Every day | ORAL | Status: DC
  Administered 2011-12-04 – 2011-12-05 (×2): 40 mg via ORAL
  Filled 2011-12-04 (×2): qty 1

## 2011-12-04 MED ORDER — SODIUM CHLORIDE 0.9 % IV SOLN
INTRAVENOUS | Status: DC
  Administered 2011-12-04: 04:00:00 via INTRAVENOUS

## 2011-12-04 MED ORDER — MIDAZOLAM HCL 5 MG/5ML IJ SOLN
INTRAMUSCULAR | Status: AC
  Filled 2011-12-04: qty 5

## 2011-12-04 MED ORDER — FENTANYL CITRATE 0.05 MG/ML IJ SOLN
INTRAMUSCULAR | Status: AC
  Filled 2011-12-04: qty 2

## 2011-12-04 MED ORDER — CALCIUM CARBONATE ANTACID 500 MG PO CHEW
1.0000 | CHEWABLE_TABLET | Freq: Four times a day (QID) | ORAL | Status: DC | PRN
  Administered 2011-12-04 – 2011-12-05 (×2): 200 mg via ORAL
  Filled 2011-12-04: qty 1

## 2011-12-04 MED ORDER — SODIUM CHLORIDE 0.9 % IJ SOLN: 3.0000 mL | INTRAMUSCULAR | Status: DC | PRN

## 2011-12-04 MED ORDER — ONDANSETRON HCL 4 MG/2ML IJ SOLN: 4.0000 mg | Freq: Once | INTRAMUSCULAR | Status: AC

## 2011-12-04 MED ORDER — ASPIRIN EC 81 MG PO TBEC
81.0000 mg | DELAYED_RELEASE_TABLET | Freq: Every day | ORAL | Status: DC
  Administered 2011-12-04 – 2011-12-05 (×2): 81 mg via ORAL
  Filled 2011-12-04 (×2): qty 1

## 2011-12-04 MED ORDER — VANCOMYCIN HCL 1000 MG IV SOLR
1500.0000 mg | INTRAVENOUS | Status: DC
  Filled 2011-12-04 (×2): qty 1500

## 2011-12-04 MED ORDER — ONDANSETRON HCL 4 MG/2ML IJ SOLN: 4.0000 mg | Freq: Four times a day (QID) | INTRAMUSCULAR | Status: DC | PRN

## 2011-12-04 MED ORDER — DUTASTERIDE 0.5 MG PO CAPS
0.5000 mg | ORAL_CAPSULE | Freq: Every day | ORAL | Status: DC
  Administered 2011-12-04 – 2011-12-05 (×2): 0.5 mg via ORAL
  Filled 2011-12-04 (×2): qty 1

## 2011-12-04 MED ORDER — INSULIN GLARGINE 100 UNIT/ML ~~LOC~~ SOLN
16.0000 [IU] | Freq: Every day | SUBCUTANEOUS | Status: DC
  Administered 2011-12-04: 16 [IU] via SUBCUTANEOUS

## 2011-12-04 NOTE — ED Notes (Signed)
Report to Barbara Cower, Charity fundraiser at [facility].

## 2011-12-04 NOTE — Consult Note (Signed)
  Reason for Consult:symptomatic bradycardia  Referring Physician: Lenon Kuennen is an 78 y.o. male.   HPI: 78 yo man with a h/o HTN and DM admitted with acute diastolic CHF and 2:1 heart block with a wide QRS (RBBB). He has felt poorly for several days. Prior to that he had done well. He denies chest pain. No syncope or peripheral edema.  PMH: Past Medical History  Diagnosis Date  . Arthritis   . Diabetes mellitus   . Anemia   . PVD (peripheral vascular disease)     PSHX: Past Surgical History  Procedure Date  . Shoulder surgery   . Leg amputation through knee   . Hernia repair     FAMHX:History reviewed. No pertinent family history.  Social History:  reports that he has never smoked. He does not have any smokeless tobacco history on file. He reports that he does not drink alcohol. His drug history not on file.  Allergies:  Allergies  Allergen Reactions  . Atorvastatin   . Penicillins   . Simvastatin     Medications: reviewed  Dg Chest Port 1 View  12/04/2011  *RADIOLOGY REPORT*  Clinical Data: Short of breath  PORTABLE CHEST - 1 VIEW  Comparison: 02/20/2009  Findings: Mild cardiomegaly.  Diffuse airspace disease.  More confluent consolidation in the inferior right upper lobe.  Haziness at the bases may represent small pleural effusions.  No pneumothorax.  IMPRESSION: Bilateral airspace disease. More confluent consolidation in the right upper lobe.  Small pleural effusions suspected.  Original Report Authenticated By: Donavan Burnet, M.D.    ROS  As stated in the HPI and negative for all other systems.  Physical Exam  Vitals:Blood pressure 151/35, pulse 39, temperature 98 F (36.7 C), temperature source Oral, resp. rate 19, height 5\' 8"  (1.727 m), weight 196 lb 13.9 oz (89.3 kg), SpO2 98.00%.  Elderly but Well appearing NAD HEENT: Unremarkable Neck:  No JVD, no thyromegally Lungs: rales in the bases bilaterally. No increased work of breathing and no  wheezes. HEART:  Regular rate rhythm, no murmurs, no rubs, no clicks Abd:  Flat, positive bowel sounds, no organomegally, no rebound, no guarding Ext:  2 plus pulses, no edema, no cyanosis, no clubbing Skin:  No rashes no nodules Neuro:  CN II through XII intact, motor grossly intact  Tele - NSR with 2:1 AVB ECG - NSR with 2:1 AV block Assessment/Plan: 1. Symptomatic bradycardia 2. 2:1 av block 3. Diastolic CHF Rec: I have discussed the treatment options with the patient and his daughter. The risks/benefits/goals/expectations of PPM have been discussed and he wishes to proceed.  Lewayne Bunting, M.D.  Sharlot Gowda TaylorMD 12/04/2011, 1:57 PM

## 2011-12-04 NOTE — Progress Notes (Signed)
*  PRELIMINARY RESULTS* Echocardiogram 2D Echocardiogram has been performed.  Glean Salen [facility] 12/04/2011, 3:04 PM

## 2011-12-04 NOTE — H&P (Signed)
Primary Cardiologist: None  CC: Weakness, possible 2:1 AV Block.  History of Present Illness: 78 year old white male with a past medical history significant for peripheral vascular disease status post right AKA, diabetes, hyperlipidemia, and chronic right bundle branch block, who presents as a transfer from [facility] for evaluation and management of potential heart block.  The patient is somewhat nonfunctional and has been so for many years. He lives at a nursing home at [facility] in [place], [place]. He is been there for 3 years since a leg injury. He has severe osteoarthritis is essentially dependent for almost all of his activities of daily living. He needs assistance with dressing himself, feeding himself, toileting himself, and transferring himself. He can feed himself but has somewhat difficulty doing this because his arthritis.  He does not walk around he only ambulates with a wheelchair. He reports that over the past one week, he's had progressive weakness. On May 1, he started on Levaquin for a possible pneumonia. Since that time, he has had decreased appetite, as well as generalized weakness and fatigue associated with nausea. He had some difficulty with urination over the past few days and had to get a catheterization so that he could urinate. He was started on ciprofloxacin, and he also started complaining of some loose stool.  Because of these symptoms. He came to the emergency department for evaluation.  I was called for evaluation of possible heart block.  The EKG from 7:20 PM demonstrated sinus rhythm with probable 2-1 AV block. This EKG is very difficult to interpret because the amplitude P waves are very small. However, it does seem that there is a sinus mechanism with 2-1 AV block. He does have a ventricular rhythm of approximately 45 beats per minute. His ventricular rhythm is a wide QRS with a right bundle branch block, anterior fascicular block and evidence of a possible lateral  MI. This EKG was faxed to me and I agreed to accept the patient to 2900. Initially, I was thinking that he would need a pacemaker; however, after discussing with the patient and realizing his other comorbid conditions, we will need a further assessment whether or not he is a candidate for pacemaker. Currently, the patient is complaining of some weakness and nausea, but has no chest pain, presyncope, or syncope. He does complain of some mild shortness of breath for the past one week.  On his most recent EKG (130 am) it is unclear if he is in heart block - this seems to be a junctional rhythm at 48 BPM with a RBBB/LAFB and retrograde V-A activation.    PMH: 1. DM 2. BPH 3. HLD 4. Iron deficiency Anemia 5. S/P R AKA 6. Chronic RBBB  SOCIAL HISTORY: The patient denies tobacco use. He lives in a nursing home.  She HPI for details.   FAMILY HISTORY: His brother recently got a PPM.   REVIEW OF SYSTEMS: All systems reviewed and are negative except as mentioned above in the HPI.     Medications Prior to Admission  Medication Sig Dispense Refill  . acetic acid-aluminum acetate (DOMEBORO OTIC) 2 % otic solution Place 2 drops into both ears 2 (two) times daily as needed.      Marland Kitchen aspirin EC 81 MG tablet Take 81 mg by mouth daily.      . cetirizine (ZYRTEC) 10 MG tablet Take 10 mg by mouth daily.      . Choline Fenofibrate (TRILIPIX) 135 MG capsule Take 135 mg by mouth daily.      Marland Kitchen  docusate sodium (COLACE) 100 MG capsule Take 100 mg by mouth 2 (two) times daily.      Marland Kitchen dutasteride (AVODART) 0.5 MG capsule Take 0.5 mg by mouth daily.      Marland Kitchen FLUoxetine (PROZAC) 10 MG capsule Take 10 mg by mouth daily.      . fluticasone (FLONASE) 50 MCG/ACT nasal spray Place 2 sprays into the nose daily.      . hydroxypropyl methylcellulose (ISOPTO TEARS) 2.5 % ophthalmic solution Place 1 drop into both eyes 3 (three) times daily as needed. For dry eyes      . insulin glargine (LANTUS) 100 UNIT/ML injection Inject 16  Units into the skin at bedtime.      . lidocaine (LIDODERM) 5 % Place 1 patch onto the skin daily. Remove & Discard patch within 12 hours or as directed by MD      . metFORMIN (GLUCOPHAGE) 1000 MG tablet Take 1,000 mg by mouth 2 (two) times daily with a meal.      . metFORMIN (GLUCOPHAGE) 500 MG tablet Take 500 mg by mouth daily at 12 noon.      Marland Kitchen olopatadine (PATANOL) 0.1 % ophthalmic solution Place 1 drop into both eyes 2 (two) times daily as needed.      Marland Kitchen omeprazole (PRILOSEC) 40 MG capsule Take 40 mg by mouth 2 (two) times daily.      Bertram Gala Glycol-Propyl Glycol (SYSTANE) 0.4-0.3 % GEL Apply 1 application to eye 4 (four) times daily as needed. For dry eyes      . polyethylene glycol (MIRALAX / GLYCOLAX) packet Take 17 g by mouth daily.      . rosuvastatin (CRESTOR) 20 MG tablet Take 20 mg by mouth every evening.      . Tamsulosin HCl (FLOMAX) 0.4 MG CAPS Take 0.4 mg by mouth daily after supper.      . Vitamin D, Ergocalciferol, (DRISDOL) 50000 UNITS CAPS Take 50,000 Units by mouth every 30 (thirty) days.      Marland Kitchen zolpidem (AMBIEN) 5 MG tablet Take 5 mg by mouth at bedtime as needed. For sleep        ALLERGY: Allergies  Allergen Reactions  . Atorvastatin   . Penicillins   . Simvastatin      PHYSICAL EXAMINATION: Filed Vitals:   12/04/11 0200  BP: 162/46  Pulse:   Temp:   Resp: 26    Vitals: P 48, BP 170/80 GENERAL: no acute distress EYES: PERRL, EOMI ENT: Oropharynx is clear.  Dentition is within normal limits NECK:  There is no JVD, or carotid bruits. No masses HEART: bradycardic with no murmurs, rubs, or gallops LUNGS: Clear to auscultation bilaterally ABDOMEN:  +BS, soft, NTND EXTREMITIES:  No clubbing, cyanosis, or edema.  S/P R AKA PULSES:   2+ radial B.  NEUROLOGIC: AOx3 SKIN: erythema on buttocks but no breakdown  EKG: see HPI  Results for orders placed during the hospital encounter of 12/03/11 (from the past 24 hour(s))  CBC     Status: Abnormal    Collection Time   12/03/11  7:24 PM      Component Value Range   WBC 6.5  4.0 - 10.5 (K/uL)   RBC 3.97 (*) 4.22 - 5.81 (MIL/uL)   Hemoglobin 8.9 (*) 13.0 - 17.0 (g/dL)   HCT 16.1 (*) 09.6 - 52.0 (%)   MCV 75.8 (*) 78.0 - 100.0 (fL)   MCH 22.4 (*) 26.0 - 34.0 (pg)   MCHC 29.6 (*) 30.0 - 36.0 (g/dL)  RDW 16.1 (*) 11.5 - 15.5 (%)   Platelets 237  150 - 400 (K/uL)  DIFFERENTIAL     Status: Normal   Collection Time   12/03/11  7:24 PM      Component Value Range   Neutrophils Relative 68  43 - 77 (%)   Neutro Abs 4.5  1.7 - 7.7 (K/uL)   Lymphocytes Relative 19  12 - 46 (%)   Lymphs Abs 1.2  0.7 - 4.0 (K/uL)   Monocytes Relative 10  3 - 12 (%)   Monocytes Absolute 0.6  0.1 - 1.0 (K/uL)   Eosinophils Relative 2  0 - 5 (%)   Eosinophils Absolute 0.2  0.0 - 0.7 (K/uL)   Basophils Relative 1  0 - 1 (%)   Basophils Absolute 0.1  0.0 - 0.1 (K/uL)  BASIC METABOLIC PANEL     Status: Abnormal   Collection Time   12/03/11  7:24 PM      Component Value Range   Sodium 137  135 - 145 (mEq/L)   Potassium 4.5  3.5 - 5.1 (mEq/L)   Chloride 103  96 - 112 (mEq/L)   CO2 25  19 - 32 (mEq/L)   Glucose, Bld 109 (*) 70 - 99 (mg/dL)   BUN 13  6 - 23 (mg/dL)   Creatinine, Ser 4.54  0.50 - 1.35 (mg/dL)   Calcium 9.2  8.4 - 09.8 (mg/dL)   GFR calc non Af Amer >90  >90 (mL/min)   GFR calc Af Amer >90  >90 (mL/min)  POCT I-STAT TROPONIN I     Status: Normal   Collection Time   12/03/11  7:28 PM      Component Value Range   Troponin i, poc 0.00  0.00 - 0.08 (ng/mL)   Comment 3           URINALYSIS, ROUTINE W REFLEX MICROSCOPIC     Status: Abnormal   Collection Time   12/03/11  8:28 PM      Component Value Range   Color, Urine AMBER (*) YELLOW    APPearance CLEAR  CLEAR    Specific Gravity, Urine 1.015  1.005 - 1.030    pH 5.5  5.0 - 8.0    Glucose, UA NEGATIVE  NEGATIVE (mg/dL)   Hgb urine dipstick LARGE (*) NEGATIVE    Bilirubin Urine NEGATIVE  NEGATIVE    Ketones, ur NEGATIVE  NEGATIVE (mg/dL)    Protein, ur 119 (*) NEGATIVE (mg/dL)   Urobilinogen, UA 0.2  0.0 - 1.0 (mg/dL)   Nitrite POSITIVE (*) NEGATIVE    Leukocytes, UA NEGATIVE  NEGATIVE   URINE MICROSCOPIC-ADD ON     Status: Normal   Collection Time   12/03/11  8:28 PM      Component Value Range   WBC, UA 0-2  <3 (WBC/hpf)   RBC / HPF 7-10  <3 (RBC/hpf)   Bacteria, UA RARE  RARE   OCCULT BLOOD, POC DEVICE     Status: Normal   Collection Time   12/03/11  9:34 PM      Component Value Range   Fecal Occult Bld NEGATIVE    MRSA PCR SCREENING     Status: Normal   Collection Time   12/04/11  1:30 AM      Component Value Range   MRSA by PCR NEGATIVE  NEGATIVE     ASSESSMENT AND PLAN:  78 year old white male with a past medical history significant for diabetes mellitus, status post right AKA,  chronic osteoarthritis, and chronic debilitation with inability to perform activities of daily living, who presents with one-week history of weakness and was found to have a bradycardic rhythm with possible 2-1 AV block.  1. Weakness. His symptoms are likely multifactorial in etiology. Multiple things could be accounting for this and include iron deficiency anemia, incompletely treated infection (PNA or UTI), possible C. difficile colitis, depression, failure to thrive, thyroid issues, metabolic issues, and potentially his bradycardia. I do think that he would benefit from systemic blood cultures and urine cultures. He should also have a C. difficile toxin obtained, as he has recently been on antibiotics. We will check an echocardiogram to see what is heart function is.  At this time, we will hold antibiotics. We will check a chest x-ray, as well as a TSH.  He may need ultimately to be transfused as he is mildly anemic. We will start IV fluids as he appears volume deplete and will monitor him closely.  2. Bradycardia. It is unclear what the chronicity of his bradycardia is. He has long-standing conduction disease as evidenced by his previous EKG from  2010 that showed a bifascicular block. It is unclear whether or not he is currently in a heart block because of his very low amplitude P waves. I would recommend that we monitor him on telemetry over the weekend just see how he does. We will cycle cardiac enzymes and check an echocardiogram. I did discuss the potential complications associated with a pacemaker in this patient, and how these complications may outweigh the benefits.  My preference would be to avoid a pacemaker if possible.  Given the fact he lives in a nursing home has had multiple infections, I am concerned that he would be at risk for infection of his pacemaker. Additionally it is unclear whether or not his bradycardia is the sole reason for his weakness.    3. Anemia.  This is a microcytic anemia, and I  suspected it is from iron deficiency anemia.  Start iron supplementation.  4. Diabetes mellitus. Will hold metformin and add sliding scale.  5. Hyperlipidemia. Continue home medications.  6: FEN: NS at 75 cc.hr, Electrolytes are stable, NPO after midnight.    7: DVT prophylaxis: sq heparin.  Bernestine Amass. Mayford Knife, MD Level 3 Admission

## 2011-12-04 NOTE — ED Notes (Signed)
[facility] here to transport pt to [facility].

## 2011-12-04 NOTE — Clinical Social Work Psychosocial (Signed)
     Clinical Social Work Department BRIEF PSYCHOSOCIAL ASSESSMENT 12/04/2011  Patient:  Ivan [name], Ivan [name]     Account Number:  [id number]     Admit date:  12/03/2011  Clinical Social Worker:  Hulan Fray  Date/Time:  12/04/2011 09:48 AM  Referred by:  RN  Date Referred:  12/04/2011 Referred for  Other - See comment   Other Referral:   "Admitted from any facility"   Interview type:  Patient Other interview type:    PSYCHOSOCIAL DATA Living Status:  FACILITY Admitted from facility:  [facility] Level of care:  Skilled Nursing Facility Primary support name:  Ivan [name] Primary support relationship to patient:  CHILD, ADULT Degree of support available:   supportive    CURRENT CONCERNS Current Concerns  None Noted   Other Concerns:    SOCIAL WORK ASSESSMENT / PLAN Clinical Social Worker received referral for patient being admitted from facility. CSW met with patient and he is at [facility], in [place], [place]. Plans are to return at discharge. Patient stated that his daughters live close by have a good relationship with them. He stated his daughter Ivan [name] is POA. CSW will complete FL2 for MD's signature and continue to follow to facilitate discharge when patient is medically stable.   Assessment/plan status:  Psychosocial Support/Ongoing Assessment of Needs Other assessment/ plan:   Information/referral to community resources:    PATIENTS/FAMILYS RESPONSE TO PLAN OF CARE: Patient plans to return at [facility] at discharge.

## 2011-12-04 NOTE — Progress Notes (Signed)
INITIAL ADULT NUTRITION ASSESSMENT Date: 12/04/2011   Time: 12:23 PM  Reason for Assessment: Nutrition Risk Report  ASSESSMENT: Male 78 y.o.  Dx: AV block, 2nd degree  Hx:  Past Medical History  Diagnosis Date  . Arthritis   . Diabetes mellitus   . Anemia   . PVD (peripheral vascular disease)     Related Meds:     . antiseptic oral rinse  15 mL Mouth Rinse BID  . aspirin EC  81 mg Oral Daily  . docusate sodium  100 mg Oral BID  . dutasteride  0.5 mg Oral Daily  . FLUoxetine  10 mg Oral Daily  . heparin  5,000 Units Subcutaneous Q8H  . insulin aspart  0-9 Units Subcutaneous TID WC  . insulin glargine  16 Units Subcutaneous QHS  . loratadine  10 mg Oral Daily  . pantoprazole  40 mg Oral Q1200  . polyethylene glycol  17 g Oral Daily  . sodium chloride  3 mL Intravenous Q12H  . Tamsulosin HCl  0.4 mg Oral QPC supper    Ht: 5\' 8"  (172.7 cm)  Wt: 196 lb 13.9 oz (89.3 kg)  Ideal Wt: 61.6 kg % Ideal Wt: 145%  Usual Wt: unable to obtain % Usual Wt: ---  BMI = 34.2 kg/m2 -- adjusted for AKA  Food/Nutrition Related Hx: dependent feeder  Labs:  CMP     Component Value Date/Time   NA 139 12/04/2011 0346   K 4.1 12/04/2011 0346   CL 103 12/04/2011 0346   CO2 27 12/04/2011 0346   GLUCOSE 112* 12/04/2011 0346   BUN 12 12/04/2011 0346   CREATININE 0.58 12/04/2011 0346   CALCIUM 9.3 12/04/2011 0346   PROT 6.4 12/04/2011 0346   ALBUMIN 3.1* 12/04/2011 0346   AST 21 12/04/2011 0346   ALT 8 12/04/2011 0346   ALKPHOS 39 12/04/2011 0346   BILITOT 0.4 12/04/2011 0346   GFRNONAA >90 12/04/2011 0346   GFRAA >90 12/04/2011 0346     Intake/Output Summary (Last 24 hours) at 12/04/11 1229 Last data filed at 12/04/11 1100  Gross per 24 hour  Intake   1275 ml  Output   1400 ml  Net   -125 ml    CBG (last 3)   Basename 12/04/11 0806  GLUCAP 98     Diet Order: Carb Control  Supplements/Tube Feeding: N/A  IVF:    sodium chloride Last Rate: 75 mL/hr at 12/04/11 0400    DISCONTD: sodium chloride Last Rate: 125 mL/hr at 12/03/11 1951    Estimated Nutritional Needs:   Kcal: 1900-2100 Protein: 95-105 gm Fluid: 2.0-2.2 L  RD unable to obtain nutrition hx from patient at this time; presented as a transfer from [facility] for evaluation and management of potential heart block; resides at [facility]; per H&P needs assistance with feeding; has difficulty eating given his arthritis; has had a decreased appetite, generalized weakness and fatigue associated with nausea; no current % PO intake per flowsheet records; noted patient with Stage I pressure ulcer to left heel & deep tissue injury to both sides of buttocks; would benefit from supplementation -- RD to order.  NUTRITION DIAGNOSIS: -Increased nutrient needs (NI-5.1).  Status: Ongoing  RELATED TO: wound healing  AS EVIDENCE BY: estimated nutrition needs  MONITORING/EVALUATION(Goals): Goal: meet >90% of estimated nutrition needs Monitor: PO intake, weight, labs, I/O's  EDUCATION NEEDS: -No education needs identified at this time  INTERVENTION:  Add Glucerna Shake PO BID (220 kcals, 9.9 gm protein per  8 fl oz can)  Prostat liquid protein 30 ml PO BID (100 kcals, 15 gm protein per dose)  RD to follow for nutrition care plan  Dietitian #: [phone]  DOCUMENTATION CODES Per approved criteria  -Obesity Unspecified    Alger Memos 12/04/2011, 12:23 PM

## 2011-12-04 NOTE — Op Note (Signed)
DDD PPM inserted vial the left subclavian vein without immediate complication. Full note dictated.

## 2011-12-04 NOTE — Progress Notes (Signed)
Patient Name: Ivan [name] Date of Encounter: 12/04/2011    SUBJECTIVE: He has been weak and had decreased diet. There is no chest pain or other cardiac complaint. Had coarse of Levaquin at NH completed earlier this week. No chills or fever.  TELEMETRY:  Bradycardia with 2:1 AV block and effective HR 40: Filed Vitals:   12/04/11 0700 12/04/11 0800 12/04/11 0900 12/04/11 1015  BP: 138/39 134/42 124/43 149/35  Pulse: 39     Temp: 97.9 F (36.6 C)     TempSrc: Oral     Resp: 21 20 17 23   Height:      Weight:      SpO2: 97% 98% 100% 100%    Intake/Output Summary (Last 24 hours) at 12/04/11 1047 Last data filed at 12/04/11 1000  Gross per 24 hour  Intake   1200 ml  Output   1400 ml  Net   -200 ml    LABS: Basic Metabolic Panel:  Basename 12/04/11 0346 12/03/11 1924  NA 139 137  K 4.1 4.5  CL 103 103  CO2 27 25  GLUCOSE 112* 109*  BUN 12 13  CREATININE 0.58 0.55  CALCIUM 9.3 9.2  MG -- --  PHOS -- --   CBC:  Basename 12/04/11 0346 12/03/11 1924  WBC 7.3 6.5  NEUTROABS -- 4.5  HGB 9.0* 8.9*  HCT 30.0* 30.1*  MCV 76.1* 75.8*  PLT 238 237   Hemoglobin A1C:  Basename 12/04/11 0346  HGBA1C 5.8*   Fasting Lipid Panel: No results found for this basename: CHOL,HDL,LDLCALC,TRIG,CHOLHDL,LDLDIRECT in the last 72 hours  Radiology/Studies:  PORTABLE CHEST - 1 VIEW  Comparison: 02/20/2009  Findings: Mild cardiomegaly. Diffuse airspace disease. More  confluent consolidation in the inferior right upper lobe. Haziness  at the bases may represent small pleural effusions. No  pneumothorax.  IMPRESSION:  Bilateral airspace disease. More confluent consolidation in the  right upper lobe.  Small pleural effusions suspected.  Original Report Authenticated By: Donavan Burnet, M.D.   Physical Exam: Blood pressure 149/35, pulse 39, temperature 97.9 F (36.6 C), temperature source Oral, resp. rate 23, height 5\' 8"  (1.727 m), weight 89.3 kg (196 lb 13.9 oz), SpO2 100.00%.  Weight change:    Bradycardia Pallor Chest is clear. Right BKA  ASSESSMENT:  1. Bradcardia with low output and probable CHF 2. Acute on chronic DHF 3. High grade AV block   Plan:  1. Spoke to daughter [name], and he is a Do Not Resusitate 2. Check BNP 3. Transfer to stepdown 4. EP consult 5. Diuresis  Signed, Lesleigh Noe 12/04/2011, 10:47 AM

## 2011-12-05 ENCOUNTER — Inpatient Hospital Stay (HOSPITAL_COMMUNITY): Payer: PRIVATE HEALTH INSURANCE

## 2011-12-05 DIAGNOSIS — R001 Bradycardia, unspecified: Secondary | ICD-10-CM | POA: Diagnosis present

## 2011-12-05 DIAGNOSIS — R531 Weakness: Secondary | ICD-10-CM | POA: Diagnosis present

## 2011-12-05 DIAGNOSIS — D649 Anemia, unspecified: Secondary | ICD-10-CM | POA: Diagnosis present

## 2011-12-05 DIAGNOSIS — Z950 Presence of cardiac pacemaker: Secondary | ICD-10-CM | POA: Diagnosis not present

## 2011-12-05 LAB — BASIC METABOLIC PANEL
BUN: 12 mg/dL (ref 6–23)
CO2: 29 mEq/L (ref 19–32)
Calcium: 9.2 mg/dL (ref 8.4–10.5)
GFR calc non Af Amer: >90 mL/min (ref >90)
Glucose, Bld: 135 mg/dL — ABNORMAL HIGH (ref 70–99)
Potassium: 3.9 mEq/L (ref 3.5–5.1)
Sodium: 141 mEq/L (ref 135–145)

## 2011-12-05 LAB — URINE CULTURE: Culture: NO GROWTH

## 2011-12-05 LAB — GLUCOSE, CAPILLARY: Glucose-Capillary: 127 mg/dL — ABNORMAL HIGH (ref 70–99)

## 2011-12-05 NOTE — Discharge Summary (Signed)
Patient ID: Ivan [name] MRN: [id number] DOB/AGE: 1926-11-05 78 y.o.  Admit date: 12/03/2011 Discharge date: 12/05/2011  Primary Discharge Diagnosis: Symptomatic bradycardia, second degree AV block/high degree AV block/2-1 AV block  Secondary Discharge Diagnosis: Pacemaker implantation, diabetes, benign prostatic hypertrophy, hyperlipidemia, iron deficiency anemia, status post right AKA, dementia, bifascicular block  Significant Diagnostic Studies: Echocardiogram-mildly reduced ejection fraction of approximately 45%, very mild aortic stenosis with peak velocity of 2.2 m/s.  Consults: Electrophysiology, Dr. Tammy Sours Taylor-implantation of St. Jude dual-chamber pacemaker  Hospital Course: History of Present Illness: To quote history of present illness "78 year old white male with a past medical history significant for peripheral vascular disease status post right AKA, diabetes, hyperlipidemia, and chronic right bundle branch block, who presents as a transfer from [facility] for evaluation and management of potential heart block. The patient is somewhat nonfunctional and has been so for many years. He lives at a nursing home at [facility] in [place], [place]. He is been there for 3 years since a leg injury. He has severe osteoarthritis is essentially dependent for almost all of his activities of daily living. He needs assistance with dressing himself, feeding himself, toileting himself, and transferring himself. He can feed himself but has somewhat difficulty doing this because his arthritis. He does not walk around he only ambulates with a wheelchair. He reports that over the past one week, he's had progressive weakness. On May 1, he started on Levaquin for a possible pneumonia. Since that time, he has had decreased appetite, as well as generalized weakness and fatigue associated with nausea. He had some difficulty with urination over the past few days and had to get a catheterization so that he could  urinate. He was started on ciprofloxacin, and he also started complaining of some loose stool.  Because of these symptoms. He came to the emergency department for evaluation. I was called for evaluation of possible heart block. The EKG from 7:20 PM demonstrated sinus rhythm with probable 2-1 AV block. This EKG is very difficult to interpret because the amplitude P waves are very small. However, it does seem that there is a sinus mechanism with 2-1 AV block. He does have a ventricular rhythm of approximately 45 beats per minute. His ventricular rhythm is a wide QRS with a right bundle branch block, anterior fascicular block and evidence of a possible lateral MI. This EKG was faxed to me and I agreed to accept the patient to 2900. Initially, I was thinking that he would need a pacemaker; however, after discussing with the patient and realizing his other comorbid conditions, we will need a further assessment whether or not he is a candidate for pacemaker. Currently, the patient is complaining of some weakness and nausea, but has no chest pain, presyncope, or syncope. He does complain of some mild shortness of breath for the past one week. On his most recent EKG (130 am) it is unclear if he is in heart block - this seems to be a junctional rhythm at 48 BPM with a RBBB/LAFB and retrograde V-A activation."  He demonstrated significant bradycardia and Dr. Sharrell Ku of electrophysiology saw him in consultation and because of the symptomatic bradycardia with 2:1 AV block and underlying diastolic heart failure he was brought to the EP lab for pacemaker implantation.  His granddaughter was present for discussion. Pacemaker implantation with St. Jude dual-chamber device was performed. He tolerated this well. Ambien was used to help him sleep.  Early the morning of discharge, chest x-ray was performed  which showed normal lead placement. There is no evidence of pneumothorax upon my inspection. Pacemaker was interrogated  and functioning normally. He had to noise her versions, 1 was recorded at 4:15 PM yesterday May 10. They were brief. Pacemaker function change to DOO then reverted back to DDD immediately after. This was on the ventricular lead. The impedances are normal this morning. While interrogating him this morning, there was no evidence of noise in ventricular lead.  He had been experiencing urinary retention a Foley catheter was placed on Thursday according to his granddaughter. This has been kept in place. His urinalysis showed no evidence of white blood cells. I instructed his granddaughter that this should be removed at the discretion of the supervising physician at [facility], Dr. Arlyce Dice when he feels that he is ready.  TSH was normal. BNP was elevated at 6000 likely secondary to diastolic dysfunction which may have been enhanced by significant bradycardia. Once again, on exam/subjectively he is not in any signs of heart failure.  I feel comfortable allowing him to go back to [facility] today. He is in his usual state of health. His daughter verifies this.   Discharge Exam: Blood pressure 128/70, pulse 92, temperature 98.1 F (36.7 C), temperature source Oral, resp. rate 18, height 5\' 8"  (1.727 m), weight 89.3 kg (196 lb 13.9 oz), SpO2 99.00%.    General: Quiet man, laying in bed, overweight, elderly in no acute distress. No increased respiratory effort while laying flat.  Lungs: Clear to auscultation bilaterally  Cardio vascular: Regular rate and rhythm, split S2, 2/6 systolic murmur right upper sternal border  Pacemaker site dressed, intact.  Abdomen-obese positive bowel sounds nontender  Extremities-right AKA, left limb without any significant edema him a Foley catheter in place  Labs:   Lab Results  Component Value Date   WBC 7.3 12/04/2011   HGB 9.0* 12/04/2011   HCT 30.0* 12/04/2011   MCV 76.1* 12/04/2011   PLT 238 12/04/2011    Lab 12/04/11 0346  NA 139  K 4.1  CL 103   CO2 27  BUN 12  CREATININE 0.58  CALCIUM 9.3  PROT 6.4  BILITOT 0.4  ALKPHOS 39  ALT 8  AST 21  GLUCOSE 112*   Lab Results  Component Value Date   CKTOTAL 3579* 02/10/2008   CKMB 44.1* 02/09/2008   TROPONINI  Value: 0.02        NO INDICATION OF MYOCARDIAL INJURY. 02/09/2008    Lab Results  Component Value Date   CHOL 245* 03/13/2008   CHOL  Value: 147        ATP III CLASSIFICATION:  <200     mg/dL   Desirable  161-096  mg/dL   Borderline High  >=045    mg/dL   High 11/02/8117   CHOL 151 09/13/2007   Lab Results  Component Value Date   HDL 36.8* 03/13/2008   HDL 43 01/11/2008   HDL 42.2 09/13/2007   Lab Results  Component Value Date   LDLCALC  Value: 80        Total Cholesterol/HDL:CHD Risk Coronary Heart Disease Risk Table                     Men   Women  1/2 Average Risk   3.4   3.3 01/11/2008   LDLCALC 75 09/13/2007   LDLCALC 67 06/14/2007   Lab Results  Component Value Date   TRIG 274* 03/13/2008   TRIG 118 01/11/2008  TRIG 167* 09/13/2007   Lab Results  Component Value Date   CHOLHDL 6.7 CALC 03/13/2008   CHOLHDL 3.4 01/11/2008   CHOLHDL 3.6 CALC 09/13/2007   Lab Results  Component Value Date   LDLDIRECT 160.2 03/13/2008   LDLDIRECT 139.5 09/07/2006      Radiology: Chest x-ray personally viewed shows no evidence of pneumothorax, mild interstitial edema or decreased inspiratory effort.  Leads appear to be in normal position. Formal read pending.  EKG: Ventricular paced rhythm.  FOLLOW UP PLANS AND APPOINTMENTS Discharge Orders    Future Orders Please Complete By Expires   Increase activity slowly        No changes were made in medications Medication List  As of 12/05/2011  8:02 AM   TAKE these medications         acetic acid-aluminum acetate 2 % otic solution   Commonly known as: DOMEBORO OTIC   Place 2 drops into both ears 2 (two) times daily as needed.      aspirin EC 81 MG tablet   Take 81 mg by mouth daily.      cetirizine 10 MG tablet   Commonly known  as: ZYRTEC   Take 10 mg by mouth daily.      docusate sodium 100 MG capsule   Commonly known as: COLACE   Take 100 mg by mouth 2 (two) times daily.      dutasteride 0.5 MG capsule   Commonly known as: AVODART   Take 0.5 mg by mouth daily.      FLUoxetine 10 MG capsule   Commonly known as: PROZAC   Take 10 mg by mouth daily.      fluticasone 50 MCG/ACT nasal spray   Commonly known as: FLONASE   Place 2 sprays into the nose daily.      hydroxypropyl methylcellulose 2.5 % ophthalmic solution   Commonly known as: ISOPTO TEARS   Place 1 drop into both eyes 3 (three) times daily as needed. For dry eyes      insulin glargine 100 UNIT/ML injection   Commonly known as: LANTUS   Inject 16 Units into the skin at bedtime.      lidocaine 5 %   Commonly known as: LIDODERM   Place 1 patch onto the skin daily. Remove & Discard patch within 12 hours or as directed by MD      metFORMIN 500 MG tablet   Commonly known as: GLUCOPHAGE   Take 500 mg by mouth daily at 12 noon.      metFORMIN 1000 MG tablet   Commonly known as: GLUCOPHAGE   Take 1,000 mg by mouth 2 (two) times daily with a meal.      olopatadine 0.1 % ophthalmic solution   Commonly known as: PATANOL   Place 1 drop into both eyes 2 (two) times daily as needed.      omeprazole 40 MG capsule   Commonly known as: PRILOSEC   Take 40 mg by mouth 2 (two) times daily.      polyethylene glycol packet   Commonly known as: MIRALAX / GLYCOLAX   Take 17 g by mouth daily.      rosuvastatin 20 MG tablet   Commonly known as: CRESTOR   Take 20 mg by mouth every evening.      SYSTANE 0.4-0.3 % Gel   Generic drug: Polyethyl Glycol-Propyl Glycol   Apply 1 application to eye 4 (four) times daily as needed. For dry eyes  Tamsulosin HCl 0.4 MG Caps   Commonly known as: FLOMAX   Take 0.4 mg by mouth daily after supper.      TRILIPIX 135 MG capsule   Generic drug: Choline Fenofibrate   Take 135 mg by mouth daily.      Vitamin D  (Ergocalciferol) 50000 UNITS Caps   Commonly known as: DRISDOL   Take 50,000 Units by mouth every 30 (thirty) days.      zolpidem 5 MG tablet   Commonly known as: AMBIEN   Take 5 mg by mouth at bedtime as needed. For sleep           Follow-up Information    Follow up with Lewayne Bunting, MD. Schedule an appointment as soon as possible for a visit in 1 week. (Please call to verify appt in one week for wound  check.)    Contact information:   [address] [place] [address] [phone]       Follow up with Lesleigh Noe, MD. Schedule an appointment as soon as possible for a visit in 1 month.   Contact information:   [address] [place] [address] [place] [address] [phone]          BRING ALL MEDICATIONS WITH YOU TO FOLLOW UP APPOINTMENTS  Time spent with patient to include physician time: 35 minutes spent with patient, pacemaker interrogation, review of medical records, teaching, dictation.  SignedDonato Schultz 12/05/2011, 8:02 AM

## 2011-12-07 NOTE — Op Note (Signed)
[name], Ivan [name] NO.:  [id number]  MEDICAL RECORD NO.:  [id number]  LOCATION:                                 FACILITY:  PHYSICIAN:  Doylene Canning. Ladona Ridgel, MD    DATE OF BIRTH:  04-12-1927  DATE OF PROCEDURE:  12/04/2011 DATE OF DISCHARGE:                              OPERATIVE REPORT   PROCEDURE PERFORMED:  Insertion of a dual-chamber pacemaker.  INDICATION:  Symptomatic 2:1 heart block.  INTRODUCTION:  The patient is an 78 year old man who presented to the hospital with heart failure symptoms and 2:1 heart block and a wide QRS rhythm.  He is now referred for permanent pacemaker insertion.  PROCEDURE:  After informed consent was obtained, the patient was taken to the diagnostic EP lab in a fasting state.  After usual preparation and draping, 30 mL of lidocaine was infiltrated into the left infraclavicular region.  A 5-cm incision was carried out over this region.  Electrocautery was utilized to dissect down to the fascial plane.  The left subclavian vein was then punctured x2 and the St. Jude model 2088T, 52 cm, active fixation pacing lead, serial number [id number] was advanced into the right ventricle.  Mapping was carried out.  In the final site, the R-waves measured between 6 and 8 mV.  It should be noted that throughout the right ventricle, the R-waves were reduced.  The lead was actively fixed and then attention was turned to placement of the atrial lead.  The atrial lead was placed in anterolateral portion of the right atrium.  The serial number was [id number] and with active fixation of the lead, the P-waves measured 1.5 mV.  Initial pacing threshold was 1.5 V at 0.4 msec.  There was a prominent injury current with fixation of both atrial and ventricular leads.  At this point, the leads were secured to the subpectoral fascia with figure-of-eight silk suture.  The sewing sleeve was secured with silk suture.  Electrocautery was utilized to make  subcutaneous pocket.  Antibiotic irrigation was utilized to irrigate the pocket.  Electrocautery was utilized to assure hemostasis. The St. Jude Accent DR RF dual-chamber pacemaker, serial number [id number], was connected to the atrial and RV leads and placed back in the subcutaneous pocket.  The pocket was irrigated with additional antibiotic irrigation and the incision was closed with 2-0 and 3-0 Vicryl.  Benzoin and Steri-Strips were painted on the skin, pressure dressing was applied, the patient was returned to his room in satisfactory condition.  COMPLICATIONS:  There were no immediate procedure complications.  RESULTS:  This demonstrates successful implantation of a St. Jude dual- chamber pacemaker in a patient with symptomatic 2:1 heart block.     Doylene Canning. Ladona Ridgel, MD    GWT/MEDQ  D:  12/04/2011  T:  12/04/2011  Job:  [id number]

## 2011-12-10 ENCOUNTER — Ambulatory Visit (INDEPENDENT_AMBULATORY_CARE_PROVIDER_SITE_OTHER): Payer: PRIVATE HEALTH INSURANCE | Admitting: *Deleted

## 2011-12-10 ENCOUNTER — Encounter: Payer: Self-pay | Admitting: Internal Medicine

## 2011-12-10 DIAGNOSIS — I498 Other specified cardiac arrhythmias: Secondary | ICD-10-CM

## 2011-12-10 DIAGNOSIS — R001 Bradycardia, unspecified: Secondary | ICD-10-CM

## 2011-12-10 DIAGNOSIS — I441 Atrioventricular block, second degree: Secondary | ICD-10-CM

## 2011-12-10 LAB — PACEMAKER DEVICE OBSERVATION
AL AMPLITUDE: 2.1 mv
BAMS-0001: 150 {beats}/min
BAMS-0003: 70 {beats}/min
BATTERY VOLTAGE: 3.1133 V
VENTRICULAR PACING PM: 96

## 2011-12-10 NOTE — Progress Notes (Addendum)
Wound check pacer in clinic  Dr Graciela Husbands evaluated and recommended Coumadin x 3 mths for clot and restart Lasix for edema. Paperwork back to [facility] for changes.

## 2011-12-24 ENCOUNTER — Telehealth: Payer: Self-pay | Admitting: Internal Medicine

## 2011-12-24 NOTE — Telephone Encounter (Signed)
New problem   Per Hilda Lias, calling from [facility], need clarification on device monitor, patient is stating his device  should be pug in at all time.

## 2011-12-24 NOTE — Telephone Encounter (Signed)
Patient resident @ [facility] and because of the # of telephone lines in the facility it increases the interference with the device.  They will hook up for transmissions only

## 2011-12-31 ENCOUNTER — Telehealth: Payer: Self-pay | Admitting: Internal Medicine

## 2011-12-31 NOTE — Telephone Encounter (Signed)
New problem:  Per Sue Lush PA  [facility], did Dr. Ladona Ridgel starts patient on coumadin and why?

## 2012-01-01 NOTE — Telephone Encounter (Signed)
Reviewed chart //1st pacer check pt had left arm and hand swelling and dr Graciela Husbands started pt on coumadin/ managed by [facility]. Pt was on coumadin for one week and INR was greater than 10 per Sue Lush PA so coumadin was held and not restarted, Sue Lush said pt has a controlled GI bleed and is being scheduled for upper GI procedure, she will discuss with PCP if pt  needs to be bridged, pt left arm is back to normal status, no swelling, no redness.

## 2012-01-01 NOTE — Telephone Encounter (Signed)
Follow on previous call:  Per Sue Lush calling . Regarding the status of coumadin. Would like an answer today.

## 2012-01-08 ENCOUNTER — Ambulatory Visit (HOSPITAL_COMMUNITY)
Admission: RE | Admit: 2012-01-08 | Discharge: 2012-01-08 | Disposition: A | Discharge status: Skilled Nursing Facility | Payer: PRIVATE HEALTH INSURANCE | Source: Ambulatory Visit | Attending: Gastroenterology | Admitting: Gastroenterology

## 2012-01-08 ENCOUNTER — Encounter (HOSPITAL_COMMUNITY)
Admission: RE | Disposition: A | Discharge status: Skilled Nursing Facility | Payer: Self-pay | Source: Ambulatory Visit | Attending: Gastroenterology

## 2012-01-08 ENCOUNTER — Encounter (HOSPITAL_COMMUNITY): Payer: Self-pay

## 2012-01-08 DIAGNOSIS — R1310 Dysphagia, unspecified: Secondary | ICD-10-CM | POA: Insufficient documentation

## 2012-01-08 DIAGNOSIS — D1330 Benign neoplasm of unspecified part of small intestine: Secondary | ICD-10-CM | POA: Insufficient documentation

## 2012-01-08 DIAGNOSIS — D509 Iron deficiency anemia, unspecified: Secondary | ICD-10-CM | POA: Insufficient documentation

## 2012-01-08 DIAGNOSIS — I739 Peripheral vascular disease, unspecified: Secondary | ICD-10-CM | POA: Insufficient documentation

## 2012-01-08 DIAGNOSIS — K31819 Angiodysplasia of stomach and duodenum without bleeding: Secondary | ICD-10-CM | POA: Insufficient documentation

## 2012-01-08 DIAGNOSIS — D131 Benign neoplasm of stomach: Secondary | ICD-10-CM | POA: Insufficient documentation

## 2012-01-08 DIAGNOSIS — Z95810 Presence of automatic (implantable) cardiac defibrillator: Secondary | ICD-10-CM | POA: Insufficient documentation

## 2012-01-08 DIAGNOSIS — E119 Type 2 diabetes mellitus without complications: Secondary | ICD-10-CM | POA: Insufficient documentation

## 2012-01-08 HISTORY — DX: Presence of automatic (implantable) cardiac defibrillator: Z95.810

## 2012-01-08 HISTORY — PX: ESOPHAGOGASTRODUODENOSCOPY: SHX5428

## 2012-01-08 HISTORY — DX: Shortness of breath: R06.02

## 2012-01-08 SURGERY — EGD (ESOPHAGOGASTRODUODENOSCOPY)
Anesthesia: Moderate Sedation

## 2012-01-08 MED ORDER — SODIUM CHLORIDE 0.9 % IV SOLN: Freq: Once | INTRAVENOUS | Status: DC

## 2012-01-08 MED ORDER — MIDAZOLAM HCL 10 MG/2ML IJ SOLN
INTRAMUSCULAR | Status: AC
  Filled 2012-01-08: qty 2

## 2012-01-08 MED ORDER — FENTANYL NICU IV SYRINGE 50 MCG/ML
INJECTION | INTRAMUSCULAR | Status: DC | PRN
  Administered 2012-01-08: 25 ug via INTRAVENOUS

## 2012-01-08 MED ORDER — MIDAZOLAM HCL 10 MG/2ML IJ SOLN
INTRAMUSCULAR | Status: DC | PRN
  Administered 2012-01-08: 1 mg via INTRAVENOUS

## 2012-01-08 MED ORDER — FENTANYL CITRATE 0.05 MG/ML IJ SOLN
INTRAMUSCULAR | Status: AC
  Filled 2012-01-08: qty 2

## 2012-01-08 NOTE — Op Note (Signed)
[facility] [address] [place], [place]  [address]  OPERATIVE PROCEDURE REPORT  PATIENT:  Ivan [name], Ivan [name]  MR#:  [id number] BIRTHDATE:  1927-04-22  GENDER:  male ENDOSCOPIST:  Jeani Hawking, MD ASSISTANT:  Loleta Rose. Andrey Campanile and Darral Dash, RN PROCEDURE DATE:  01/08/2012 PROCEDURE:  EGD, diagnostic 43235 ASA CLASS:  Class III INDICATIONS:  IDA and Dysphagia MEDICATIONS:  Fentanyl 25 mcg IV, Versed 1 mg IV  DESCRIPTION OF PROCEDURE:   After the risks benefits and alternatives of the procedure were thoroughly explained, informed consent was obtained.  The Pentax Gastroscope [id number] endoscope was introduced through the mouth and advanced to the third portion of the duodenum, without limitations.  The instrument was slowly withdrawn as the mucosa was fully examined. <<PROCEDUREIMAGES>>  FINDINGS:  The esophagus contained a significant amount of thick liquid material that was aspirated into the endoscope. No evidence of any strictures. At the pylorus a large 1.5 cm nonbleeding mobile polyp was identified. In the duodenal bulb a small AVM was noted as well as a small duodenal polyp. No active bleeding was noted. The procedure was difficult to perform as the patient's oxygen saturation, with minimal sedation, dropped to 73%. He was in a supine position and continual jaw thrust was required. It was clear that the patient was not able to tolerate any therapy for the polyps and AVM during this EGD. The procedure was then terminated.    Retroflexed views revealed no abnormalities.    The scope was then withdrawn from the patient and the procedure terminated.  COMPLICATIONS:  None  IMPRESSION:  1) Gastric polyp. 2) Duodenal polyp. 3) Nonbleeding small duodenal bulb AVM. RECOMMENDATIONS:  1) Okay to resume coumadin for now. 2) I will arrange a repeat EGD with anesthesia.  ______________________________ Jeani Hawking, MD  n. Rosalie DoctorJeani Hawking at 01/08/2012 11:18  AM  Jenelle Mages, [phone]

## 2012-01-08 NOTE — Discharge Instructions (Addendum)
Endoscopy Care After Please read the instructions outlined below and refer to this sheet in the next few weeks. These discharge instructions provide you with general information on caring for yourself after you leave the hospital. Your doctor may also give you specific instructions. While your treatment has been planned according to the most current medical practices available, unavoidable complications occasionally occur. If you have any problems or questions after discharge, please call your doctor. HOME CARE INSTRUCTIONS Activity  You may resume your regular activity but move at a slower pace for the next 24 hours.   Take frequent rest periods for the next 24 hours.   Walking will help expel (get rid of) the air and reduce the bloated feeling in your abdomen.   No driving for 24 hours (because of the anesthesia (medicine) used during the test).   You may shower.   Do not sign any important legal documents or operate any machinery for 24 hours (because of the anesthesia used during the test).  Nutrition  Drink plenty of fluids.   You may resume your normal diet.   Begin with a light meal and progress to your normal diet.   Avoid alcoholic beverages for 24 hours or as instructed by your caregiver.  Medications You may resume your normal medications unless your caregiver tells you otherwise. What you can expect today  You may experience abdominal discomfort such as a feeling of fullness or "gas" pains.   You may experience a sore throat for 2 to 3 days. This is normal. Gargling with salt water may help this.  Follow-up Your doctor will discuss the results of your test with you. SEEK IMMEDIATE MEDICAL CARE IF:  You have excessive nausea (feeling sick to your stomach) and/or vomiting.   You have severe abdominal pain and distention (swelling).   You have trouble swallowing.   You have a temperature over 100 F (37.8 C).   You have rectal bleeding or vomiting of blood.    Document Released: 02/25/2004 Document Revised: 07/02/2011 Document Reviewed: 09/07/2007 Exit[employer] Patient Information 2012 [employer]. 

## 2012-01-08 NOTE — H&P (Signed)
  Reason for Consult:IDA and heme positive stool Referring Physician:   Valetta Fuller HPI: The patient was identified to have an iron deficiency anemia, but no overt evidence of bleeding.  Along with his IDA he complains of dysphagia.  Further evaluation was requested to work up his anemia.   Past Medical History  Diagnosis Date  . Arthritis   . Diabetes mellitus   . Anemia   . PVD (peripheral vascular disease)   . ICD (implantable cardiac defibrillator) in place   . Shortness of breath     Past Surgical History  Procedure Date  . Shoulder surgery   . Leg amputation through knee   . Hernia repair   . Total hip arthroplasty     History reviewed. No pertinent family history.  Social History:  reports that he quit smoking about 53 years ago. His smoking use included Cigarettes. He smoked 1.5 packs per day. He does not have any smokeless tobacco history on file. He reports that he does not drink alcohol or use illicit drugs.  Allergies:  Allergies  Allergen Reactions  . Sulfa Antibiotics Rash  . Atorvastatin   . Penicillins   . Simvastatin     Medications:  Scheduled:   . sodium chloride   Intravenous Once   Continuous:   Results for orders placed during the hospital encounter of 01/08/12 (from the past 24 hour(s))  GLUCOSE, CAPILLARY     Status: Abnormal   Collection Time   01/08/12 10:46 AM      Component Value Range   Glucose-Capillary 104 (*) 70 - 99 mg/dL     No results found.  ROS:  As stated above in the HPI otherwise negative.  Blood pressure 143/60, pulse 90, temperature 98.7 F (37.1 C), temperature source Oral, resp. rate 24, height 5' 8.5" (1.74 m), weight 88.905 kg (196 lb), SpO2 100.00%.    PE: Gen: NAD, Alert and Oriented HEENT:  [place]/AT, EOMI Neck: Supple, no LAD Lungs: CTA Bilaterally CV: RRR without M/G/R ABM: Soft, NTND, +BS Ext: No C/C/E  Assessment/Plan: 1) IDA. 2) Heme positive stool. 3) Dysphagia.  Plan: 1) EGD today.  He is in  poor health and an EGD will be performed first.  If it is negative further evaluation with a colonoscopy will be pursued.  Eliza Grissinger D 01/08/2012, 10:52 AM

## 2012-01-11 ENCOUNTER — Encounter (HOSPITAL_COMMUNITY): Payer: Self-pay | Admitting: Gastroenterology

## 2012-02-26 ENCOUNTER — Encounter (HOSPITAL_COMMUNITY)
Admission: RE | Disposition: A | Discharge status: Nursing Home (Includes ICF) | Payer: Self-pay | Source: Ambulatory Visit | Attending: Gastroenterology

## 2012-02-26 ENCOUNTER — Encounter (HOSPITAL_COMMUNITY): Payer: Self-pay | Admitting: *Deleted

## 2012-02-26 ENCOUNTER — Encounter (HOSPITAL_COMMUNITY): Payer: Self-pay | Admitting: Anesthesiology

## 2012-02-26 ENCOUNTER — Ambulatory Visit (HOSPITAL_COMMUNITY): Payer: PRIVATE HEALTH INSURANCE | Admitting: Anesthesiology

## 2012-02-26 ENCOUNTER — Ambulatory Visit (HOSPITAL_COMMUNITY)
Admission: RE | Admit: 2012-02-26 | Discharge: 2012-02-26 | Disposition: A | Discharge status: Other Acute Inpatient Hospital | Payer: PRIVATE HEALTH INSURANCE | Source: Ambulatory Visit | Attending: Gastroenterology | Admitting: Gastroenterology

## 2012-02-26 DIAGNOSIS — E119 Type 2 diabetes mellitus without complications: Secondary | ICD-10-CM | POA: Insufficient documentation

## 2012-02-26 DIAGNOSIS — K219 Gastro-esophageal reflux disease without esophagitis: Secondary | ICD-10-CM | POA: Insufficient documentation

## 2012-02-26 DIAGNOSIS — Z95810 Presence of automatic (implantable) cardiac defibrillator: Secondary | ICD-10-CM | POA: Insufficient documentation

## 2012-02-26 DIAGNOSIS — D649 Anemia, unspecified: Secondary | ICD-10-CM | POA: Insufficient documentation

## 2012-02-26 DIAGNOSIS — D131 Benign neoplasm of stomach: Secondary | ICD-10-CM | POA: Insufficient documentation

## 2012-02-26 DIAGNOSIS — K319 Disease of stomach and duodenum, unspecified: Secondary | ICD-10-CM | POA: Insufficient documentation

## 2012-02-26 DIAGNOSIS — I10 Essential (primary) hypertension: Secondary | ICD-10-CM | POA: Insufficient documentation

## 2012-02-26 DIAGNOSIS — I739 Peripheral vascular disease, unspecified: Secondary | ICD-10-CM | POA: Insufficient documentation

## 2012-02-26 DIAGNOSIS — Z79899 Other long term (current) drug therapy: Secondary | ICD-10-CM | POA: Insufficient documentation

## 2012-02-26 DIAGNOSIS — M129 Arthropathy, unspecified: Secondary | ICD-10-CM | POA: Insufficient documentation

## 2012-02-26 DIAGNOSIS — Z794 Long term (current) use of insulin: Secondary | ICD-10-CM | POA: Insufficient documentation

## 2012-02-26 DIAGNOSIS — D3A010 Benign carcinoid tumor of the duodenum: Secondary | ICD-10-CM | POA: Insufficient documentation

## 2012-02-26 HISTORY — PX: ESOPHAGOGASTRODUODENOSCOPY: SHX5428

## 2012-02-26 HISTORY — PX: BALLOON DILATION: SHX5330

## 2012-02-26 SURGERY — EGD (ESOPHAGOGASTRODUODENOSCOPY)
Anesthesia: Monitor Anesthesia Care

## 2012-02-26 MED ORDER — LACTATED RINGERS IV SOLN
INTRAVENOUS | Status: DC
  Administered 2012-02-26: 1000 mL via INTRAVENOUS

## 2012-02-26 MED ORDER — PROPOFOL 10 MG/ML IV EMUL
INTRAVENOUS | Status: DC | PRN
  Administered 2012-02-26: 100 ug/kg/min via INTRAVENOUS

## 2012-02-26 NOTE — Transfer of Care (Signed)
Immediate Anesthesia Transfer of Care Note  Patient: Ivan [name]  Procedure(s) Performed: Procedure(s) (LRB): ESOPHAGOGASTRODUODENOSCOPY (EGD) (N/A) BALLOON DILATION (N/A)  Patient Location: PACU  Anesthesia Type: MAC  Level of Consciousness: awake and alert   Airway & Oxygen Therapy: Patient Spontanous Breathing and Patient connected to nasal cannula oxygen  Post-op Assessment: Report given to PACU RN and Post -op Vital signs reviewed and stable  Post vital signs: Reviewed and stable  Complications: No apparent anesthesia complications

## 2012-02-26 NOTE — Op Note (Signed)
[facility] [address] [place], [place]  [address]  OPERATIVE PROCEDURE REPORT  PATIENT:  Kutter, Schnepf  MR#:  [id number] BIRTHDATE:  09/03/1926  GENDER:  male ENDOSCOPIST:  Jeani Hawking, MD ASSISTANT:  Beryle Beams, Technician and Claudie Revering, RN CGRN PROCEDURE DATE:  02/26/2012 PROCEDURE:  EGD with snare polypectomy ASA CLASS:  Class III INDICATIONS:  Gastric polyp and duodenal polyp MEDICATIONS:  MAC sedation, administered by CRNA  DESCRIPTION OF PROCEDURE:   After the risks benefits and alternatives of the procedure were thoroughly explained, informed consent was obtained.  The [id number] endoscope was introduced through the mouth and advanced to the second portion of the duodenum, without limitations.  The instrument was slowly withdrawn as the mucosa was fully examined. <<PROCEDUREIMAGES>>  FINDINGS:  Upon initial entry into the esophagus there was a significant amount of secretions again. This was quickly suctioned. No evidence of any strictures. In the pyloric channel the large 1.5 cm pedunculated polyp was again identified. This was snared and removed with a hot cautery. The duodenal polyp was then identified in the bulb of the duodenum. Three attempts were made to remove it with a cold snare as it meausred 4 mm in size, however, it was firm. This is suspicious for a carcinoid. A hot snare was then applied to the area and an excellent resection was achieved. The very small atypical appearing AVM was not identified. No evidence of any bleeding. While withdrawing the endoscope all secretions and fluid was aspirated from the lumen of the stomach, esophagus, and pharynx.    Retroflexed views revealed no abnormalities.    The scope was then withdrawn from the patient and the procedure terminated.  COMPLICATIONS:  None  IMPRESSION:  1) Pedunculated pyloric channel polyp and duodenal bulb polyp. RECOMMENDATIONS:  1) Await biopsy results. 2) Hold coumadin for  another 5 days. 3) Follow up in the office in 4 weeks.  ______________________________ Jeani Hawking, MD  n. Rosalie DoctorJeani Hawking at 02/26/2012 10:53 AM  Jenelle Mages, [phone]

## 2012-02-26 NOTE — Anesthesia Postprocedure Evaluation (Signed)
  Anesthesia Post-op Note  Patient: Ivan [name]  Procedure(s) Performed: Procedure(s) (LRB): ESOPHAGOGASTRODUODENOSCOPY (EGD) (N/A) BALLOON DILATION (N/A)  Patient Location: PACU  Anesthesia Type: MAC  Level of Consciousness: awake and alert   Airway and Oxygen Therapy: Patient Spontanous Breathing  Post-op Pain: mild  Post-op Assessment: Post-op Vital signs reviewed, Patient's Cardiovascular Status Stable, Respiratory Function Stable, Patent Airway and No signs of Nausea or vomiting  Post-op Vital Signs: stable  Complications: No apparent anesthesia complications

## 2012-02-26 NOTE — H&P (Signed)
  Reason for Consult: Anemia and Gastric/Duodenal polyps Referring Physician: Dara Hoyer, M.D>  Valetta Fuller HPI: This is an 78 year old patient with a history of anemia and heme positive stool.  He was originally to undergo an EGD/Colonoscopy, but with his poor health only an EGD was performed on 12/2011.  The findings were positive for a 1.5 cm  pyloric polyp,  4 mm duodenal polyp, and a small nonbleeding AVM.  During that procedure the patient had difficulty with his oxygen saturation and subsequently no therapy with the EGD was able to be performed.  He is here today to repeat the EGD with therapy under anesthesia.  The patient's wife reports that he has issues with dysphagia, but during the last EGD no strictures were noted.  However, he had a significant amount of secretions in the upper esophagus that was quickly suctioned.  Past Medical History  Diagnosis Date  . Arthritis   . Diabetes mellitus   . Anemia   . PVD (peripheral vascular disease)   . ICD (implantable cardiac defibrillator) in place   . Shortness of breath     Past Surgical History  Procedure Date  . Shoulder surgery   . Leg amputation through knee   . Hernia repair   . Total hip arthroplasty   . Esophagogastroduodenoscopy 01/08/2012    Procedure: ESOPHAGOGASTRODUODENOSCOPY (EGD);  Surgeon: Theda Belfast, MD;  Location: [facility];  Service: Endoscopy;  Laterality: N/A;    History reviewed. No pertinent family history.  Social History:  reports that he quit smoking about 53 years ago. His smoking use included Cigarettes. He smoked 1.5 packs per day. He does not have any smokeless tobacco history on file. He reports that he does not drink alcohol or use illicit drugs.  Allergies:  Allergies  Allergen Reactions  . Sulfa Antibiotics Rash  . Atorvastatin   . Penicillins   . Simvastatin     Medications:  Scheduled:  Continuous:   . lactated ringers Stopped (02/26/12 1040)    Results for orders placed  during the hospital encounter of 02/26/12 (from the past 24 hour(s))  GLUCOSE, CAPILLARY     Status: Normal   Collection Time   02/26/12 10:04 AM      Component Value Range   Glucose-Capillary 92  70 - 99 mg/dL     No results found.  ROS:  As stated above in the HPI otherwise negative.  Temperature 97.9 F (36.6 C), temperature source Oral.    PE: Gen: NAD, Alert and Oriented, weak appearing HEENT:  [place]/AT, EOMI Neck: Supple, no LAD Lungs: CTA Bilaterally CV: RRR without M/G/R ABM: Soft, NTND, +BS Ext: No C/C/E  Assessment/Plan: 1) Pyloric channel polyp. 2) Duodenal polyp. 3) Very small nonbleeding duodenal bulb AVM.  Plan: 1) EGD with anesthesia to resect the lesions.  Taneah Masri D 02/26/2012, 10:22 AM

## 2012-02-26 NOTE — Anesthesia Preprocedure Evaluation (Addendum)
Anesthesia Evaluation  Patient identified by MRN, date of birth, ID band Patient awake    Reviewed: Allergy & Precautions, H&P , NPO status , Patient's Chart, lab work & pertinent test results  Airway Mallampati: II TM Distance: >3 FB Neck ROM: Full    Dental No notable dental hx.    Pulmonary shortness of breath,  Nasal passage gets blocked and may need upward traction to maintain airway per patient and daughet. breath sounds clear to auscultation  Pulmonary exam normal       Cardiovascular hypertension, Pt. on medications + dysrhythmias + pacemaker Rhythm:Regular Rate:Normal  Pacemaker placed 12/04/11 for second degree AV block. No AICD   Neuro/Psych PSYCHIATRIC DISORDERS Anxiety Depression  Neuromuscular disease    GI/Hepatic Neg liver ROS, GERD-  Medicated,  Endo/Other  Type 2, Oral Hypoglycemic Agents and Insulin Dependent  Renal/GU negative Renal ROS  negative genitourinary   Musculoskeletal negative musculoskeletal ROS (+)   Abdominal   Peds negative pediatric ROS (+)  Hematology negative hematology ROS (+)   Anesthesia Other Findings   Reproductive/Obstetrics negative OB ROS                          Anesthesia Physical Anesthesia Plan  ASA: III  Anesthesia Plan: MAC   Post-op Pain Management:    Induction: Intravenous  Airway Management Planned:   Additional Equipment:   Intra-op Plan:   Post-operative Plan: Extubation in OR  Informed Consent: I have reviewed the patients History and Physical, chart, labs and discussed the procedure including the risks, benefits and alternatives for the proposed anesthesia with the patient or authorized representative who has indicated his/her understanding and acceptance.   Dental advisory given  Plan Discussed with: CRNA  Anesthesia Plan Comments:         Anesthesia Quick Evaluation

## 2012-02-29 ENCOUNTER — Encounter (HOSPITAL_COMMUNITY): Payer: Self-pay | Admitting: Gastroenterology

## 2012-03-12 ENCOUNTER — Encounter (HOSPITAL_COMMUNITY): Payer: Self-pay | Admitting: Internal Medicine

## 2012-03-12 ENCOUNTER — Emergency Department (HOSPITAL_COMMUNITY): Payer: PRIVATE HEALTH INSURANCE

## 2012-03-12 ENCOUNTER — Inpatient Hospital Stay (HOSPITAL_COMMUNITY)
Admission: EM | Admit: 2012-03-12 | Discharge: 2012-03-24 | DRG: 291 | Disposition: A | Discharge status: Skilled Nursing Facility | Payer: PRIVATE HEALTH INSURANCE | Attending: Internal Medicine | Admitting: Internal Medicine

## 2012-03-12 DIAGNOSIS — E1169 Type 2 diabetes mellitus with other specified complication: Secondary | ICD-10-CM

## 2012-03-12 DIAGNOSIS — Z794 Long term (current) use of insulin: Secondary | ICD-10-CM

## 2012-03-12 DIAGNOSIS — S78119A Complete traumatic amputation at level between unspecified hip and knee, initial encounter: Secondary | ICD-10-CM

## 2012-03-12 DIAGNOSIS — I739 Peripheral vascular disease, unspecified: Secondary | ICD-10-CM

## 2012-03-12 DIAGNOSIS — I82629 Acute embolism and thrombosis of deep veins of unspecified upper extremity: Secondary | ICD-10-CM | POA: Diagnosis present

## 2012-03-12 DIAGNOSIS — I5043 Acute on chronic combined systolic (congestive) and diastolic (congestive) heart failure: Principal | ICD-10-CM | POA: Diagnosis present

## 2012-03-12 DIAGNOSIS — E669 Obesity, unspecified: Secondary | ICD-10-CM | POA: Diagnosis present

## 2012-03-12 DIAGNOSIS — M129 Arthropathy, unspecified: Secondary | ICD-10-CM | POA: Diagnosis present

## 2012-03-12 DIAGNOSIS — R531 Weakness: Secondary | ICD-10-CM | POA: Diagnosis present

## 2012-03-12 DIAGNOSIS — L8993 Pressure ulcer of unspecified site, stage 3: Secondary | ICD-10-CM | POA: Diagnosis not present

## 2012-03-12 DIAGNOSIS — I441 Atrioventricular block, second degree: Secondary | ICD-10-CM | POA: Diagnosis present

## 2012-03-12 DIAGNOSIS — I10 Essential (primary) hypertension: Secondary | ICD-10-CM | POA: Diagnosis present

## 2012-03-12 DIAGNOSIS — Y9210 Unspecified residential institution as the place of occurrence of the external cause: Secondary | ICD-10-CM | POA: Diagnosis not present

## 2012-03-12 DIAGNOSIS — Z7982 Long term (current) use of aspirin: Secondary | ICD-10-CM

## 2012-03-12 DIAGNOSIS — J9692 Respiratory failure, unspecified with hypercapnia: Secondary | ICD-10-CM

## 2012-03-12 DIAGNOSIS — E119 Type 2 diabetes mellitus without complications: Secondary | ICD-10-CM

## 2012-03-12 DIAGNOSIS — R001 Bradycardia, unspecified: Secondary | ICD-10-CM

## 2012-03-12 DIAGNOSIS — J90 Pleural effusion, not elsewhere classified: Secondary | ICD-10-CM | POA: Diagnosis present

## 2012-03-12 DIAGNOSIS — T502X5A Adverse effect of carbonic-anhydrase inhibitors, benzothiadiazides and other diuretics, initial encounter: Secondary | ICD-10-CM | POA: Diagnosis not present

## 2012-03-12 DIAGNOSIS — Z950 Presence of cardiac pacemaker: Secondary | ICD-10-CM

## 2012-03-12 DIAGNOSIS — D649 Anemia, unspecified: Secondary | ICD-10-CM

## 2012-03-12 DIAGNOSIS — L089 Local infection of the skin and subcutaneous tissue, unspecified: Secondary | ICD-10-CM | POA: Diagnosis present

## 2012-03-12 DIAGNOSIS — E876 Hypokalemia: Secondary | ICD-10-CM | POA: Diagnosis not present

## 2012-03-12 DIAGNOSIS — K219 Gastro-esophageal reflux disease without esophagitis: Secondary | ICD-10-CM | POA: Diagnosis present

## 2012-03-12 DIAGNOSIS — Z87891 Personal history of nicotine dependence: Secondary | ICD-10-CM

## 2012-03-12 DIAGNOSIS — N179 Acute kidney failure, unspecified: Secondary | ICD-10-CM

## 2012-03-12 DIAGNOSIS — K5900 Constipation, unspecified: Secondary | ICD-10-CM | POA: Diagnosis not present

## 2012-03-12 DIAGNOSIS — L89899 Pressure ulcer of other site, unspecified stage: Secondary | ICD-10-CM | POA: Diagnosis present

## 2012-03-12 DIAGNOSIS — N400 Enlarged prostate without lower urinary tract symptoms: Secondary | ICD-10-CM

## 2012-03-12 DIAGNOSIS — J9620 Acute and chronic respiratory failure, unspecified whether with hypoxia or hypercapnia: Secondary | ICD-10-CM | POA: Diagnosis present

## 2012-03-12 DIAGNOSIS — L89109 Pressure ulcer of unspecified part of back, unspecified stage: Secondary | ICD-10-CM | POA: Diagnosis not present

## 2012-03-12 DIAGNOSIS — Z66 Do not resuscitate: Secondary | ICD-10-CM | POA: Diagnosis present

## 2012-03-12 DIAGNOSIS — E46 Unspecified protein-calorie malnutrition: Secondary | ICD-10-CM | POA: Diagnosis present

## 2012-03-12 DIAGNOSIS — L8990 Pressure ulcer of unspecified site, unspecified stage: Secondary | ICD-10-CM | POA: Diagnosis present

## 2012-03-12 DIAGNOSIS — Z96649 Presence of unspecified artificial hip joint: Secondary | ICD-10-CM

## 2012-03-12 DIAGNOSIS — I509 Heart failure, unspecified: Secondary | ICD-10-CM

## 2012-03-12 DIAGNOSIS — I2510 Atherosclerotic heart disease of native coronary artery without angina pectoris: Secondary | ICD-10-CM | POA: Diagnosis present

## 2012-03-12 DIAGNOSIS — I252 Old myocardial infarction: Secondary | ICD-10-CM

## 2012-03-12 DIAGNOSIS — Z79899 Other long term (current) drug therapy: Secondary | ICD-10-CM

## 2012-03-12 DIAGNOSIS — I82409 Acute embolism and thrombosis of unspecified deep veins of unspecified lower extremity: Secondary | ICD-10-CM

## 2012-03-12 DIAGNOSIS — F411 Generalized anxiety disorder: Secondary | ICD-10-CM

## 2012-03-12 HISTORY — DX: Acute myocardial infarction, unspecified: I21.9

## 2012-03-12 HISTORY — DX: Presence of cardiac pacemaker: Z95.0

## 2012-03-12 HISTORY — DX: Heart failure, unspecified: I50.9

## 2012-03-12 HISTORY — DX: Atherosclerotic heart disease of native coronary artery without angina pectoris: I25.10

## 2012-03-12 HISTORY — DX: Anxiety disorder, unspecified: F41.9

## 2012-03-12 LAB — POCT I-STAT 3, ART BLOOD GAS (G3+)
Bicarbonate: 41.3 mEq/L — ABNORMAL HIGH (ref 20.0–24.0)
Bicarbonate: 40.7 mEq/L — ABNORMAL HIGH (ref 20.0–24.0)
Patient temperature: 98.6
TCO2: 44 mmol/L (ref 0–100)
TCO2: 43 mmol/L (ref 0–100)
pCO2 arterial: 74.9 mmHg (ref 35.0–45.0)
pCO2 arterial: 63.9 mmHg (ref 35.0–45.0)
pH, Arterial: 7.35 (ref 7.350–7.450)
pH, Arterial: 7.412 (ref 7.350–7.450)
pO2, Arterial: 61 mmHg — ABNORMAL LOW (ref 80.0–100.0)

## 2012-03-12 LAB — COMPREHENSIVE METABOLIC PANEL
ALT: 11 U/L (ref 0–53)
AST: 34 U/L (ref 0–37)
CO2: 39 mEq/L — ABNORMAL HIGH (ref 19–32)
Chloride: 92 mEq/L — ABNORMAL LOW (ref 96–112)
Creatinine, Ser: 0.87 mg/dL (ref 0.50–1.35)
GFR calc non Af Amer: 78 mL/min — ABNORMAL LOW (ref >90)
Glucose, Bld: 107 mg/dL — ABNORMAL HIGH (ref 70–99)
Total Bilirubin: 0.9 mg/dL (ref 0.3–1.2)

## 2012-03-12 LAB — PROTIME-INR: INR: 1.3 (ref 0.00–1.49)

## 2012-03-12 LAB — URINALYSIS, ROUTINE W REFLEX MICROSCOPIC
Bilirubin Urine: NEGATIVE
Hgb urine dipstick: NEGATIVE
Specific Gravity, Urine: 1.013 (ref 1.005–1.030)
pH: 5 (ref 5.0–8.0)

## 2012-03-12 LAB — CBC
Hemoglobin: 8.9 g/dL — ABNORMAL LOW (ref 13.0–17.0)
MCV: 76.7 fL — ABNORMAL LOW (ref 78.0–100.0)
Platelets: 243 10*3/uL (ref 150–400)
RBC: 3.95 MIL/uL — ABNORMAL LOW (ref 4.22–5.81)
WBC: 8 10*3/uL (ref 4.0–10.5)

## 2012-03-12 MED ORDER — HEPARIN (PORCINE) IN NACL 100-0.45 UNIT/ML-% IJ SOLN
1300.0000 [IU]/h | INTRAMUSCULAR | Status: DC
  Administered 2012-03-13: 1300 [IU]/h via INTRAVENOUS
  Filled 2012-03-12 (×2): qty 250

## 2012-03-12 MED ORDER — LORAZEPAM 0.5 MG PO TABS
0.2500 mg | ORAL_TABLET | Freq: Two times a day (BID) | ORAL | Status: DC | PRN
  Administered 2012-03-16: 0.25 mg via ORAL
  Filled 2012-03-12: qty 1

## 2012-03-12 MED ORDER — POLYETHYLENE GLYCOL 3350 17 G PO PACK
17.0000 g | PACK | Freq: Every day | ORAL | Status: DC
  Administered 2012-03-13 – 2012-03-22 (×8): 17 g via ORAL
  Filled 2012-03-12 (×10): qty 1

## 2012-03-12 MED ORDER — FLUOXETINE HCL 20 MG PO CAPS
30.0000 mg | ORAL_CAPSULE | Freq: Every day | ORAL | Status: DC
  Administered 2012-03-13 – 2012-03-24 (×12): 30 mg via ORAL
  Filled 2012-03-12 (×12): qty 1

## 2012-03-12 MED ORDER — LACRI-LUBE S.O.P. OP OINT: 0.2500 | TOPICAL_OINTMENT | Freq: Every evening | OPHTHALMIC | Status: DC | PRN

## 2012-03-12 MED ORDER — PANTOPRAZOLE SODIUM 40 MG PO TBEC
40.0000 mg | DELAYED_RELEASE_TABLET | Freq: Every day | ORAL | Status: DC
  Administered 2012-03-13: 40 mg via ORAL
  Filled 2012-03-12: qty 1

## 2012-03-12 MED ORDER — ARTIFICIAL TEARS OP OINT: TOPICAL_OINTMENT | Freq: Every evening | OPHTHALMIC | Status: DC | PRN

## 2012-03-12 MED ORDER — DOCUSATE SODIUM 100 MG PO CAPS
100.0000 mg | ORAL_CAPSULE | Freq: Two times a day (BID) | ORAL | Status: DC
  Administered 2012-03-13 – 2012-03-24 (×19): 100 mg via ORAL
  Filled 2012-03-12 (×24): qty 1

## 2012-03-12 MED ORDER — FLUTICASONE PROPIONATE 50 MCG/ACT NA SUSP
2.0000 | Freq: Every day | NASAL | Status: DC
  Administered 2012-03-13 – 2012-03-24 (×12): 2 via NASAL
  Filled 2012-03-12 (×2): qty 16

## 2012-03-12 MED ORDER — OLOPATADINE HCL 0.1 % OP SOLN
1.0000 [drp] | Freq: Two times a day (BID) | OPHTHALMIC | Status: DC
  Administered 2012-03-13 – 2012-03-24 (×23): 1 [drp] via OPHTHALMIC
  Filled 2012-03-12 (×2): qty 5

## 2012-03-12 MED ORDER — NITROGLYCERIN 2 % TD OINT
1.0000 [in_us] | TOPICAL_OINTMENT | Freq: Four times a day (QID) | TRANSDERMAL | Status: DC
  Administered 2012-03-12 – 2012-03-22 (×39): 1 [in_us] via TOPICAL
  Filled 2012-03-12 (×7): qty 30
  Filled 2012-03-12: qty 1
  Filled 2012-03-12 (×15): qty 30

## 2012-03-12 MED ORDER — DUTASTERIDE 0.5 MG PO CAPS
0.5000 mg | ORAL_CAPSULE | Freq: Every day | ORAL | Status: DC
  Administered 2012-03-13 – 2012-03-24 (×12): 0.5 mg via ORAL
  Filled 2012-03-12 (×12): qty 1

## 2012-03-12 MED ORDER — FUROSEMIDE 10 MG/ML IJ SOLN
120.0000 mg | Freq: Three times a day (TID) | INTRAVENOUS | Status: DC
  Administered 2012-03-12 – 2012-03-13 (×3): 120 mg via INTRAVENOUS
  Filled 2012-03-12 (×6): qty 12

## 2012-03-12 MED ORDER — ONDANSETRON HCL 4 MG PO TABS: 4.0000 mg | ORAL_TABLET | Freq: Four times a day (QID) | ORAL | Status: DC | PRN

## 2012-03-12 MED ORDER — INSULIN ASPART 100 UNIT/ML ~~LOC~~ SOLN: 0.0000 [IU] | Freq: Every day | SUBCUTANEOUS | Status: DC

## 2012-03-12 MED ORDER — POTASSIUM CHLORIDE CRYS ER 20 MEQ PO TBCR
20.0000 meq | EXTENDED_RELEASE_TABLET | Freq: Every day | ORAL | Status: DC
  Filled 2012-03-12 (×3): qty 1

## 2012-03-12 MED ORDER — ASPIRIN EC 81 MG PO TBEC
81.0000 mg | DELAYED_RELEASE_TABLET | Freq: Every day | ORAL | Status: DC
  Administered 2012-03-13 – 2012-03-24 (×12): 81 mg via ORAL
  Filled 2012-03-12 (×12): qty 1

## 2012-03-12 MED ORDER — ZOLPIDEM TARTRATE 5 MG PO TABS
5.0000 mg | ORAL_TABLET | Freq: Every day | ORAL | Status: DC
  Administered 2012-03-13 (×2): 5 mg via ORAL
  Filled 2012-03-12 (×2): qty 1

## 2012-03-12 MED ORDER — FUROSEMIDE 10 MG/ML IJ SOLN
80.0000 mg | Freq: Once | INTRAMUSCULAR | Status: AC
  Administered 2012-03-12: 80 mg via INTRAVENOUS
  Filled 2012-03-12: qty 8

## 2012-03-12 MED ORDER — LIDOCAINE 5 % EX PTCH
1.0000 | MEDICATED_PATCH | Freq: Every day | CUTANEOUS | Status: DC
  Filled 2012-03-12 (×12): qty 1

## 2012-03-12 MED ORDER — POLYSACCHARIDE IRON COMPLEX 150 MG PO CAPS
150.0000 mg | ORAL_CAPSULE | Freq: Every day | ORAL | Status: DC
  Administered 2012-03-13 – 2012-03-24 (×12): 150 mg via ORAL
  Filled 2012-03-12 (×12): qty 1

## 2012-03-12 MED ORDER — ONDANSETRON HCL 4 MG/2ML IJ SOLN
4.0000 mg | Freq: Four times a day (QID) | INTRAMUSCULAR | Status: DC | PRN
  Administered 2012-03-14: 4 mg via INTRAVENOUS
  Filled 2012-03-12: qty 2

## 2012-03-12 MED ORDER — HEPARIN BOLUS VIA INFUSION
3000.0000 [IU] | Freq: Once | INTRAVENOUS | Status: AC
  Administered 2012-03-13: 3000 [IU] via INTRAVENOUS

## 2012-03-12 MED ORDER — SENNOSIDES-DOCUSATE SODIUM 8.6-50 MG PO TABS
1.0000 | ORAL_TABLET | Freq: Every day | ORAL | Status: DC
  Administered 2012-03-13 – 2012-03-21 (×9): 1 via ORAL
  Filled 2012-03-12 (×11): qty 1

## 2012-03-12 MED ORDER — LORATADINE 10 MG PO TABS
5.0000 mg | ORAL_TABLET | Freq: Every day | ORAL | Status: DC
  Administered 2012-03-13 – 2012-03-23 (×11): 5 mg via ORAL
  Administered 2012-03-24: 11:00:00 via ORAL
  Filled 2012-03-12 (×12): qty 0.5

## 2012-03-12 MED ORDER — LORATADINE 5 MG PO CHEW: 5.0000 mg | CHEWABLE_TABLET | Freq: Every day | ORAL | Status: DC

## 2012-03-12 MED ORDER — DOBUTAMINE IN D5W 4-5 MG/ML-% IV SOLN
1.0000 ug/kg/min | INTRAVENOUS | Status: DC
  Administered 2012-03-12: 5 ug/kg/min via INTRAVENOUS
  Administered 2012-03-13: 2.5 ug/kg/min via INTRAVENOUS
  Administered 2012-03-13: 3.5 ug/kg/min via INTRAVENOUS
  Filled 2012-03-12 (×2): qty 250

## 2012-03-12 MED ORDER — INSULIN GLARGINE 100 UNIT/ML ~~LOC~~ SOLN
5.0000 [IU] | Freq: Every day | SUBCUTANEOUS | Status: DC
  Administered 2012-03-13: 5 [IU] via SUBCUTANEOUS

## 2012-03-12 MED ORDER — SPIRONOLACTONE 25 MG PO TABS
25.0000 mg | ORAL_TABLET | Freq: Two times a day (BID) | ORAL | Status: DC
  Administered 2012-03-13 – 2012-03-24 (×23): 25 mg via ORAL
  Filled 2012-03-12 (×26): qty 1

## 2012-03-12 MED ORDER — SODIUM CHLORIDE 0.9 % IJ SOLN: 3.0000 mL | Freq: Two times a day (BID) | INTRAMUSCULAR | Status: DC

## 2012-03-12 MED ORDER — ADULT MULTIVITAMIN W/MINERALS CH
1.0000 | ORAL_TABLET | Freq: Every day | ORAL | Status: DC
  Administered 2012-03-13 – 2012-03-24 (×12): 1 via ORAL
  Filled 2012-03-12 (×12): qty 1

## 2012-03-12 MED ORDER — INSULIN ASPART 100 UNIT/ML ~~LOC~~ SOLN
0.0000 [IU] | Freq: Three times a day (TID) | SUBCUTANEOUS | Status: DC
  Administered 2012-03-14 – 2012-03-15 (×3): 1 [IU] via SUBCUTANEOUS
  Administered 2012-03-16 – 2012-03-18 (×5): 2 [IU] via SUBCUTANEOUS
  Administered 2012-03-18 – 2012-03-19 (×3): 1 [IU] via SUBCUTANEOUS
  Administered 2012-03-19: 3 [IU] via SUBCUTANEOUS
  Administered 2012-03-20: 1 [IU] via SUBCUTANEOUS
  Administered 2012-03-20 – 2012-03-21 (×2): 2 [IU] via SUBCUTANEOUS
  Administered 2012-03-22 (×3): 1 [IU] via SUBCUTANEOUS
  Administered 2012-03-23 (×2): 2 [IU] via SUBCUTANEOUS
  Administered 2012-03-23: 1 [IU] via SUBCUTANEOUS
  Administered 2012-03-24: 2 [IU] via SUBCUTANEOUS

## 2012-03-12 NOTE — ED Notes (Signed)
PT reported to be Ivan [name] this AM . Pt lives at [facility] . An EKG was done with a possible new onset of A-FIB. PT denies CP . He is O2  Dependent on 2 liters But EMS increased rate to 4 liters due to [facility]. Pt A/O on arrival to ED. Increased swelling  Noted in Lt hand . Wound present on arrival with bandage intact LLE.

## 2012-03-12 NOTE — Progress Notes (Signed)
ANTICOAGULATION CONSULT NOTE - Initial Consult  Pharmacy Consult for Heparin Indication: DVT (suspected LUE DVT)  Allergies  Allergen Reactions  . Sulfa Antibiotics Rash  . Atorvastatin   . Penicillins   . Simvastatin     Patient Measurements: Height: 5' 8.5" (174 cm) (on 01/08/12) Weight: 195 lb 15.8 oz (88.9 kg) (on 01/08/12) IBW/kg (Calculated) : 69.56  Heparin Dosing Weight: 87 kg  Vital Signs: Temp: 98.2 F (36.8 C) (08/17 1347) Temp src: Oral (08/17 1347) BP: 108/53 mmHg (08/17 2005) Pulse Rate: 79  (08/17 2005)  Labs:  Basename 03/12/12 1553 03/12/12 1552  HGB 8.9* --  HCT 30.3* --  PLT 243 --  APTT 32 --  LABPROT 16.4* --  INR 1.30 --  HEPARINUNFRC -- --  CREATININE 0.87 --  CKTOTAL -- --  CKMB -- --  TROPONINI -- <0.30    Estimated Creatinine Clearance: 70.3 ml/min (by C-G formula based on Cr of 0.87).   Medical History: Past Medical History  Diagnosis Date  . Arthritis   . Diabetes mellitus   . Anemia   . PVD (peripheral vascular disease)   . ICD (implantable cardiac defibrillator) in place   . Shortness of breath     Medications:  ASA, Trilipix, Prozac, Flonase, Lasix, Lantus, Niferex, Lidocaine patch, Claritin, Lorazepam, Metformin, Prilosec, Miralax, KCl, Crestor, Senna S, Spironolactone, Vit D, Ambien  Assessment: 78 yo M presented to ED 03/12/2012 with worsening SOB and increased swelling in the LUE to start heparin for r/o VTE.  Pt is a NH resident on 2L O2 baseline with hx CAD, HF, heart block s/p PPM, DM, and PVD s/p R AKA.  Noted, recent EGD 12/2011 for anemia and heme + stool.  Was on Coumadin in the past for possible DVT in LUE but pt has been off Coumadin since his GIB and EGD procedure.    Will start with conservative dose based on recent GIB.  Goal of Therapy:  Heparin level 0.3-0.7 units/ml Monitor platelets by anticoagulation protocol: Yes   Plan:  1. Heparin 3000 unit IV bolus x 1. 2. Heparin infusion at 1300 units/hr. 3.  Heparin level 6 hours after infusion started. 4. Heparin level and CBC daily.  [employer], Pharm.D., BCPS Clinical Pharmacist Pager [phone] 03/12/2012 8:59 PM

## 2012-03-12 NOTE — ED Provider Notes (Cosign Needed)
History     CSN: [id number]  Arrival date & time 03/12/12  1336   First MD Initiated Contact with Patient 03/12/12 1339      Chief Complaint  Patient presents with  . Shortness of Breath    (Consider location/radiation/quality/duration/timing/severity/associated sxs/prior treatment) HPI Comments: Pt is an 78 year old man who lives at [facility].  He has had "fluid" coming on for some time, with gradually worsening shortness of breath.  He breaths oxygen at home.  He appeared cyanotic at the nursing home and was therefore sent to the ED for evaluation.  Patient is a 78 y.o. male presenting with shortness of breath. The history is provided by the patient, the nursing home, medical records and a relative. No language interpreter was used.  Shortness of Breath  The current episode started 5 to 7 days ago. The onset was gradual. The problem occurs continuously. The problem has been gradually worsening. The problem is moderate. Nothing relieves the symptoms. Nothing aggravates the symptoms. Associated symptoms include shortness of breath. Pertinent negatives include no fever. He was not exposed to toxic fumes. He has not inhaled smoke recently. He has had prior hospitalizations (Hospitalized in May, received pacemaker.). Recently, medical care has been given by a specialist and at this facility. Services received include medications given and tests performed.    Past Medical History  Diagnosis Date  . Arthritis   . Diabetes mellitus   . Anemia   . PVD (peripheral vascular disease)   . ICD (implantable cardiac defibrillator) in place   . Shortness of breath     Past Surgical History  Procedure Date  . Shoulder surgery   . Leg amputation through knee   . Hernia repair   . Total hip arthroplasty   . Esophagogastroduodenoscopy 01/08/2012    Procedure: ESOPHAGOGASTRODUODENOSCOPY (EGD);  Surgeon: Theda Belfast, MD;  Location: [facility];  Service: Endoscopy;  Laterality: N/A;  .  Esophagogastroduodenoscopy 02/26/2012    Procedure: ESOPHAGOGASTRODUODENOSCOPY (EGD);  Surgeon: Theda Belfast, MD;  Location: [facility];  Service: Endoscopy;  Laterality: N/A;  pt is wheelchair bound  . Balloon dilation 02/26/2012    Procedure: BALLOON DILATION;  Surgeon: Theda Belfast, MD;  Location: [facility];  Service: Endoscopy;  Laterality: N/A;    No family history on file.  History  Substance Use Topics  . Smoking status: Former Smoker -- 1.5 packs/day    Types: Cigarettes    Quit date: 01/08/1959  . Smokeless tobacco: Not on file  . Alcohol Use: No      Review of Systems  Constitutional: Negative.  Negative for fever and chills.  HENT: Negative.   Eyes: Negative.   Respiratory: Positive for shortness of breath.   Cardiovascular: Negative.   Gastrointestinal: Negative.   Genitourinary: Negative.   Musculoskeletal: Negative.   Skin: Negative.   Neurological: Negative.   Psychiatric/Behavioral: Negative.     Allergies  Sulfa antibiotics; Atorvastatin; Penicillins; and Simvastatin  Home Medications   Current Outpatient Rx  Name Route Sig Dispense Refill  . ACETIC ACID-ALUMINUM ACETATE 2 % OT SOLN Both Ears Place 2 drops into both ears 2 (two) times daily as needed. For infective otitis externa    . LACRI-LUBE S.O.P. OP OINT Ophthalmic Apply 0.25-0.5 strips to eye at bedtime as needed. For eye irritation    . ASPIRIN EC 81 MG PO TBEC Oral Take 81 mg by mouth daily.    Marland Kitchen CARBOXYMETHYLCELLUL-GLYCERIN 0.5-0.9 % OP SOLN Ophthalmic Apply 1-2 drops to  eye 2 (two) times daily as needed. For dry eyes    . CHOLINE FENOFIBRATE 135 MG PO CPDR Oral Take 135 mg by mouth daily.    . DESLORATADINE 5 MG PO TABS Oral Take 5 mg by mouth daily.    Marland Kitchen DOCUSATE SODIUM 100 MG PO CAPS Oral Take 100 mg by mouth 2 (two) times daily.    . DUTASTERIDE 0.5 MG PO CAPS Oral Take 0.5 mg by mouth daily.    Marland Kitchen FLUOXETINE HCL 10 MG PO CAPS Oral Take 30 mg by mouth daily.     Marland Kitchen FLUTICASONE  PROPIONATE 50 MCG/ACT NA SUSP Nasal Place 2 sprays into the nose daily.    . FUROSEMIDE 80 MG PO TABS Oral Take 80 mg by mouth daily.    Marland Kitchen HYPROMELLOSE 2.5 % OP SOLN Both Eyes Place 1 drop into both eyes 4 (four) times daily as needed. For dry eyes    . INSULIN GLARGINE 100 UNIT/ML [place] SOLN Subcutaneous Inject 5 Units into the skin at bedtime.     Marland Kitchen POLYSACCHARIDE IRON COMPLEX 150 MG PO CAPS Oral Take 150 mg by mouth daily.    Marland Kitchen LIDOCAINE 5 % EX PTCH Transdermal Place 1 patch onto the skin daily. Remove & Discard patch within 12 hours or as directed by MD    . LORAZEPAM 0.5 MG PO TABS Oral Take 0.25 mg by mouth 2 (two) times daily as needed. For anxiety    . METFORMIN HCL 1000 MG PO TABS Oral Take 1,000 mg by mouth 2 (two) times daily with a meal.    . OLOPATADINE HCL 0.1 % OP SOLN Both Eyes Place 1 drop into both eyes 2 (two) times daily as needed. For dry eyes    . OMEPRAZOLE 40 MG PO CPDR Oral Take 40 mg by mouth 2 (two) times daily.    Marland Kitchen POLYETHYL GLYCOL-PROPYL GLYCOL 0.4-0.3 % OP GEL Ophthalmic Apply 1 application to eye 4 (four) times daily as needed. For dry eyes    . POLYETHYLENE GLYCOL 3350 PO PACK Oral Take 17 g by mouth daily.    Marland Kitchen POTASSIUM CHLORIDE CRYS ER 20 MEQ PO TBCR Oral Take 20 mEq by mouth daily.    Marland Kitchen ROSUVASTATIN CALCIUM 20 MG PO TABS Oral Take 20 mg by mouth every evening.    Bernadette Hoit SODIUM 8.6-50 MG PO TABS Oral Take 1 tablet by mouth at bedtime.    . SPIRONOLACTONE 25 MG PO TABS Oral Take 25 mg by mouth 2 (two) times daily.    Marland Kitchen VITAMIN D (ERGOCALCIFEROL) 50000 UNITS PO CAPS Oral Take 50,000 Units by mouth every 30 (thirty) days.    Marland Kitchen ZOLPIDEM TARTRATE 5 MG PO TABS Oral Take 5 mg by mouth at bedtime as needed. For sleep      BP 113/75  Pulse 86  Temp 98.2 F (36.8 C) (Oral)  Resp 23  SpO2 100%  Physical Exam  Nursing note and vitals reviewed. Constitutional: He is oriented to person, place, and time.       Elderly man, obese, breathing nasal oxygen.    HENT:  Head: Normocephalic and atraumatic.  Right Ear: External ear normal.  Left Ear: External ear normal.  Mouth/Throat: Oropharynx is clear and moist.  Eyes: Conjunctivae and EOM are normal. Pupils are equal, round, and reactive to light.  Neck: Normal range of motion. Neck supple.       Unable to evaluate jugular veins due to pt habitus.  Cardiovascular: Normal rate, regular  rhythm and normal heart sounds.   Pulmonary/Chest: Effort normal. He has no rales.  Abdominal: Soft. Bowel sounds are normal.  Musculoskeletal: Normal range of motion.       His right BK stump and left leg have 2+ edema.  Neurological: He is alert and oriented to person, place, and time.       No gross sensory or motor deficit.  Skin: Skin is warm and dry.  Psychiatric: He has a normal mood and affect. His behavior is normal.    ED Course  Procedures (including critical care time)   Labs Reviewed  PRO B NATRIURETIC PEPTIDE  TROPONIN I  CBC  COMPREHENSIVE METABOLIC PANEL  PROTIME-INR  APTT  URINALYSIS, ROUTINE W REFLEX MICROSCOPIC   3:45 PM Pt seen --> physical exam performed.  Lab workup ordered.  He appears to have CHF, so IV lasix and topical NTG ordered.  Old charts reviewed.     Date: 03/12/2012  Rate: 86  Rhythm: normal sinus rhythm  QRS Axis: left  Intervals: PR prolonged QRS:  Q waves in inferior leads suggest old inferior myocardial infarction.  ST/T Wave abnormalities: normal  Conduction Disutrbances:right bundle branch block and left anterior fascicular block  Narrative Interpretation: Abnormal EKG  Old EKG Reviewed: changes noted--Pacemaker spikes were noted on tracing of 12/05/2011.  6:34 PM Results for orders placed during the hospital encounter of 03/12/12  PRO B NATRIURETIC PEPTIDE      Component Value Range   Pro B Natriuretic peptide (BNP) 23086.0 (*) 0 - 450 pg/mL  TROPONIN I      Component Value Range   Troponin I <0.30  <0.30 ng/mL  CBC      Component Value Range    WBC 8.0  4.0 - 10.5 K/uL   RBC 3.95 (*) 4.22 - 5.81 MIL/uL   Hemoglobin 8.9 (*) 13.0 - 17.0 g/dL   HCT 82.9 (*) 56.2 - 13.0 %   MCV 76.7 (*) 78.0 - 100.0 fL   MCH 22.5 (*) 26.0 - 34.0 pg   MCHC 29.4 (*) 30.0 - 36.0 g/dL   RDW 86.5 (*) 78.4 - 69.6 %   Platelets 243  150 - 400 K/uL  COMPREHENSIVE METABOLIC PANEL      Component Value Range   Sodium 137  135 - 145 mEq/L   Potassium 3.9  3.5 - 5.1 mEq/L   Chloride 92 (*) 96 - 112 mEq/L   CO2 39 (*) 19 - 32 mEq/L   Glucose, Bld 107 (*) 70 - 99 mg/dL   BUN 24 (*) 6 - 23 mg/dL   Creatinine, Ser 2.95  0.50 - 1.35 mg/dL   Calcium 9.8  8.4 - 28.4 mg/dL   Total Protein 7.3  6.0 - 8.3 g/dL   Albumin 2.9 (*) 3.5 - 5.2 g/dL   AST 34  0 - 37 U/L   ALT 11  0 - 53 U/L   Alkaline Phosphatase 47  39 - 117 U/L   Total Bilirubin 0.9  0.3 - 1.2 mg/dL   GFR calc non Af Amer 78 (*) >90 mL/min   GFR calc Af Amer 90 (*) >90 mL/min  PROTIME-INR      Component Value Range   Prothrombin Time 16.4 (*) 11.6 - 15.2 seconds   INR 1.30  0.00 - 1.49  APTT      Component Value Range   aPTT 32  24 - 37 seconds  GLUCOSE, CAPILLARY      Component Value Range   Glucose-Capillary  94  70 - 99 mg/dL   Dg Chest Portable 1 View  03/12/2012  *RADIOLOGY REPORT*  Clinical Data: 78 year old male with shortness of breath, swelling.  PORTABLE CHEST - 1 VIEW  Comparison: 12/05/2011 and earlier.  Findings: Portable semi upright AP view at 1617 hours.  More lordotic view. Stable cardiomegaly and mediastinal contours. Stable left chest cardiac pacemaker.  Increase veiling opacity on the right.  No pneumothorax.  Stable vascular congestion, no overt pulmonary edema.  Continued bibasilar opacity.  IMPRESSION: Interval increased right pleural effusion, probably moderate. Stable vascular congestion and basilar atelectasis.  Original Report Authenticated By: Harley Hallmark, M.D.    Lab workup shows CHF.  He has had minimal response to diuretic and topical NTG.  Will order BiPAP, ask  [facility] to admit him.    1. Congestive heart failure         Carleene Cooper III, MD 03/13/12 1326

## 2012-03-12 NOTE — H&P (Signed)
[facility] History and Physical  Ivan [name] UJW:[id number] DOB: [birth year] DOA: 03/12/2012  Referring physician: Dr. Benedetto Goad PCP: Oliver Barre, MD   Chief Complaint:  Shortness of breath  HPI:  The patient is an 78 year old male with history of CAD s/p MI without CABG or stent.  He had worsening CHF with 2nd degree 2:1 heart block requiring pacemaker placement 12/03/2011.  He also has diabetes, PVD s/p right AKA, left calf ulcer.   The patient was at his baseline with 2L home oxygen about 5 days ago.  He has been becoming progressively more [name] of breath.  Last night, he was lifted via [place] lift and when he was placed back in bed, he felt very [name] of breath and may have had an episode of syncope.  He turned cyanotic and they turned up his oxygen.  He had a CXR in the morning ordered by the nurse practitioner at the [facility] nursing home where he stays and it showed pulmonary edema and pleural effusions.  He was transported to the [facility] for further evalaution.  He has had a nonproductive cough, worsening swelling of the left arm, and a new fluid blister of the left leg which burst recently.  He feels like drowning when he lies back.  Dyspnea without exertion.     Per history, he has had a possible DVT in the left arm previously which was treated with coumadin.  He has not been on coumadin for several weeks due to his GI bleed and because he recently underwent EGD with biopsy on 02/27/12.    Review of Systems:  Denies fevers, chills, sinus congestion, sore throat, chest pain, chest pressure, nausea, vomiting, diarrhea.  Last bowel movement was yesterday.  Normally uses a urinal or a diaper.  Denies lymphadenopathy.  Has several ulcers on his bottom, which are covered with duoderm.  No blood in stools, no other abnormal bruising or bleeding.  No recent depressive symptoms, although he has anxiety.    Past Medical History  Diagnosis Date  . Arthritis   . Diabetes mellitus    . Anemia   . PVD (peripheral vascular disease)   . ICD (implantable cardiac defibrillator) in place   . Shortness of breath    Past Surgical History  Procedure Date  . Shoulder surgery   . Leg amputation through knee 2006  . Hernia repair   . Total hip arthroplasty     right side  . Esophagogastroduodenoscopy 01/08/2012    Procedure: ESOPHAGOGASTRODUODENOSCOPY (EGD);  Surgeon: Theda Belfast, MD;  Location: [facility];  Service: Endoscopy;  Laterality: N/A;  . Esophagogastroduodenoscopy 02/26/2012    Procedure: ESOPHAGOGASTRODUODENOSCOPY (EGD);  Surgeon: Theda Belfast, MD;  Location: [facility];  Service: Endoscopy;  Laterality: N/A;  pt is wheelchair bound  . Balloon dilation 02/26/2012    Procedure: BALLOON DILATION;  Surgeon: Theda Belfast, MD;  Location: [facility];  Service: Endoscopy;  Laterality: N/A;   Social History:  reports that he quit smoking about 53 years ago. His smoking use included Cigarettes. He smoked 1.5 packs per day. He does not have any smokeless tobacco history on file. He reports that he does not drink alcohol or use illicit drugs. Lives in a nursing home and is wheel chair bound.  Uses urinal or diaper.  He needs assistance with transfers, turning, but can feed himself.  Uses a lift to get in and out of bed and chair.  Uses bedpan.    Allergies  Allergen Reactions  . Sulfa Antibiotics Rash  . Atorvastatin   . Penicillins   . Simvastatin     Family History  Problem Relation Age of Onset  . Coronary artery disease Father   . Stroke Father   . Coronary artery disease Brother   . Cancer Mother   . Diabetes Father   . Diabetes Brother      Prior to Admission medications   Medication Sig Start Date End Date Taking? Authorizing Provider  acetic acid-aluminum acetate (DOMEBORO OTIC) 2 % otic solution Place 2 drops into both ears 2 (two) times daily as needed. For infective otitis externa   Yes Historical Provider, MD  Artificial Tear Ointment  (LACRI-LUBE S.O.P.) OINT Apply 0.25-0.5 strips to eye at bedtime as needed. For eye irritation   Yes Historical Provider, MD  aspirin EC 81 MG tablet Take 81 mg by mouth daily.   Yes Historical Provider, MD  Carboxymethylcellul-Glycerin (OPTIVE) 0.5-0.9 % SOLN Apply 1-2 drops to eye 2 (two) times daily as needed. For dry eyes   Yes Historical Provider, MD  Choline Fenofibrate (TRILIPIX) 135 MG capsule Take 135 mg by mouth daily.   Yes Historical Provider, MD  docusate sodium (COLACE) 100 MG capsule Take 100 mg by mouth 2 (two) times daily.   Yes Historical Provider, MD  dutasteride (AVODART) 0.5 MG capsule Take 0.5 mg by mouth daily.   Yes Historical Provider, MD  FLUoxetine (PROZAC) 10 MG capsule Take 30 mg by mouth daily.    Yes Historical Provider, MD  fluticasone (FLONASE) 50 MCG/ACT nasal spray Place 2 sprays into the nose daily.   Yes Historical Provider, MD  furosemide (LASIX) 80 MG tablet Take 80 mg by mouth daily.   Yes Historical Provider, MD  insulin glargine (LANTUS) 100 UNIT/ML injection Inject 5 Units into the skin at bedtime.    Yes Historical Provider, MD  iron polysaccharides (NIFEREX) 150 MG capsule Take 150 mg by mouth daily.   Yes Historical Provider, MD  lidocaine (LIDODERM) 5 % Place 1 patch onto the skin daily. Remove & Discard patch within 12 hours or as directed by MD.  Apply to painful area.   Yes Historical Provider, MD  loratadine (CLARITIN) 5 MG chewable tablet Chew 5 mg by mouth daily.   Yes Historical Provider, MD  LORazepam (ATIVAN) 0.5 MG tablet Take 0.25 mg by mouth 2 (two) times daily as needed. For anxiety   Yes Historical Provider, MD  metFORMIN (GLUCOPHAGE) 1000 MG tablet Take 1,000 mg by mouth 2 (two) times daily with a meal.   Yes Historical Provider, MD  olopatadine (PATANOL) 0.1 % ophthalmic solution Place 1 drop into both eyes 2 (two) times daily as needed. For dry eyes   Yes Historical Provider, MD  omeprazole (PRILOSEC) 40 MG capsule Take 40 mg by mouth 2  (two) times daily.   Yes Historical Provider, MD  polyethylene glycol (MIRALAX / GLYCOLAX) packet Take 17 g by mouth daily.   Yes Historical Provider, MD  potassium chloride SA (K-DUR,KLOR-CON) 20 MEQ tablet Take 20 mEq by mouth daily.   Yes Historical Provider, MD  rosuvastatin (CRESTOR) 20 MG tablet Take 20 mg by mouth every evening.   Yes Historical Provider, MD  senna-docusate (SENOKOT-S) 8.6-50 MG per tablet Take 1 tablet by mouth at bedtime.   Yes Historical Provider, MD  spironolactone (ALDACTONE) 25 MG tablet Take 25 mg by mouth 2 (two) times daily.   Yes Historical Provider, MD  Vitamin D, Ergocalciferol, (DRISDOL) 50000  UNITS CAPS Take 50,000 Units by mouth every 30 (thirty) days.   Yes Historical Provider, MD  zolpidem (AMBIEN) 5 MG tablet Take 5 mg by mouth at bedtime as needed. For sleep   Yes Historical Provider, MD   Physical Exam: Filed Vitals:   03/12/12 1347 03/12/12 1713 03/12/12 1854 03/12/12 2005  BP: 113/75 121/63 103/57 108/53  Pulse: 86 85 83 79  Temp: 98.2 F (36.8 C)     TempSrc: Oral     Resp: 23 22 18    Height:    5' 8.5" (1.74 m)  Weight:    88.9 kg (195 lb 15.8 oz)  SpO2: 100% 100% 100% 97%     General:  Obese caucasian male, moderate respiratory distress with BIPAP on face, currently 14/8, 40% FIO2  Eyes: PERRL anicteric, noninjected  ENT: nares and OP not observed secondary to bipap  Neck: supple, no lymphadenopathy, unable to assess JVP  Cardiovascular: RRR, difficult to hear secondary to bipap.  S1, S2 present  Respiratory:  Diminished breath sounds at the bases  Abdomen: NABS, soft, distended with mild tenderness in the epigastric and suprapubic region  Skin:  Left leg with a 10cm ulcer along the medial tibial line with purulent drainage with minimal erythema surrounding.  Also with small scab on back of left heel, bottom of right AKA stump.  Has duoderm patches covering similar small scabs on his sacrum  Musculoskeletal:  Pitting edema of  the buttocks, lower abdomen, left arm.  Less edema of the legs.  Psychiatric: Awake, alert, unable to communicate other than with head nods and shakes secondary to necessary bipap.    Neurologic: CN II-XII grossly intact (exam limited by mask).  Limited ROM of both arms with 5-/5 strength.  3/5 strength bilaterally lower extremities.  Decreased sensation left leg and right stump.    Labs on Admission:  Basic Metabolic Panel:  Lab 03/12/12 5784  NA 137  K 3.9  CL 92*  CO2 39*  GLUCOSE 107*  BUN 24*  CREATININE 0.87  CALCIUM 9.8  MG --  PHOS --   Liver Function Tests:  Lab 03/12/12 1553  AST 34  ALT 11  ALKPHOS 47  BILITOT 0.9  PROT 7.3  ALBUMIN 2.9*   No results found for this basename: LIPASE:5,AMYLASE:5 in the last 168 hours No results found for this basename: AMMONIA:5 in the last 168 hours CBC:  Lab 03/12/12 1553  WBC 8.0  NEUTROABS --  HGB 8.9*  HCT 30.3*  MCV 76.7*  PLT 243   Cardiac Enzymes:  Lab 03/12/12 1552  CKTOTAL --  CKMB --  CKMBINDEX --  TROPONINI <0.30    BNP (last 3 results)  Basename 03/12/12 1552 12/04/11 1053  PROBNP 23086.0* 6095.0*   CBG:  Lab 03/12/12 1705  GLUCAP 94    Radiological Exams on Admission: Dg Chest Portable 1 View  03/12/2012  *RADIOLOGY REPORT*  Clinical Data: 77 year old male with shortness of breath, swelling.  PORTABLE CHEST - 1 VIEW  Comparison: 12/05/2011 and earlier.  Findings: Portable semi upright AP view at 1617 hours.  More lordotic view. Stable cardiomegaly and mediastinal contours. Stable left chest cardiac pacemaker.  Increase veiling opacity on the right.  No pneumothorax.  Stable vascular congestion, no overt pulmonary edema.  Continued bibasilar opacity.  IMPRESSION: Interval increased right pleural effusion, probably moderate. Stable vascular congestion and basilar atelectasis.  Original Report Authenticated By: Harley Hallmark, M.D.    EKG: Independently reviewed. 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  Patient presents with respiratory failure likely due to CHF exacerbation, unknown trigger, although his lasix may have been weaned from twice daily to once daily over the last month.    CHF exacerbation with bilateral pleural effusions and hypercapneic respiratory failure.  The patient is followed by Dr. Verdis Prime at [facility] and his medications for CHF are titrated at his nursing home.  He has recently been using once daily lasix and twice daily spironolactone, which is administered by the NH.  His diet is low-salt.  Exacerbation may be due to ACS, however, EKG stable and troponin negative.  He may also have a degree of obstructive sleep apnea or obesity hypoventilation syndrome complicating the picture, however, he does not use CPAP/BiPAP.  He does not have sputum production or fevers suggestive of pneumonia.  His elevated bicarb may be due to progressive respiratory failure with renal compensation over the course of the last week.  What is remarkable is that the patient's last ECHO shows a relatively preserved EF of 45-50%. -   Strict I/Os -   Daily weights -   Currently NPO  -   Goal of -1-2 L overnight -   Lasix 120mg  IV q6h -   Avoid metolazone secondary to sulfa allergy -   Dobutamine 5mg /kg/h  -   Cardiology consult in AM -   BiPAP with repeat blood gas now -    ECHO  CAD (hypokinesis of basal-midinferior myocardium in setting of severe PVD), HTN, HL -   On ASA -   Consider adding low-dose beta-blocker pending d/c of dobutamine gtt -   Holding statin and fibrate due to reported allergy and renal failure.  Bilateral pleural effusions, likely secondary to CHF exacerbation and transudative, however, may be contributing the respiratory distress.  Patient appears to be improving with bipap and diuresis -    Consider thoracentesis if respiratory distress does not improve.    Acute kidney injury, may be cardiorenal, but consider obstruction also.   -   FEurea -   Obtaining US abd complete which should include kidney -   Foley catheter removed of urine from bladder so mildly obstructed - will use for strict urine output monitoring overnight while resp status is tenuous and readdress in AM -   Renally dosed medications -    Avoid nephrotoxic agents  Left arm swelling with history of previous DVT, off AC -   Heparin gtt  -   Monitor closely for signs of bleeding given recent GIB and biopsy -   US of the left upper extremity to rule out DVT  Abdominal discomfort, likely due to gut edema, urinary retention, but also consider cardiac ascites +/- SBP (less likely), or mesenteric ischemia in a vasculopath (less likely given degree and timing of symptoms) -   Abd [place] to eval for ascites -    Diuresis as above  Left leg ulcer with purulent drainage concerning for infection, however no fever or white count and no gross cellulitis -   Wound care consult -   Start broad spectrum abx if appears to worsen  Diabetes, fingerstick 108 upon arrival in ER -    Continue lantus 5 units -    SSI low-coverage  Anemia, stable from prior.  Likely not contributing to dyspnea -   Monitor for now  GERD -   Continue PPI  DIET:  NPO overnight ACCESS:  PIV (may need PICC) PROPH:  Heparin gtt pending results of extremity duplex  Code Status: Full code  Family  Communication: Daughter and brother and sister-in-law presetn  Disposition Plan: Pending stable respiratory status, tolerating medications and diet, improving or stable kidney function.  Time spent: 120  Renae Fickle [facility] Pager [phone]  If 7PM-7AM, please contact night-coverage [email or web address] Password [email or web address] 03/12/2012, 9:00 PM

## 2012-03-12 NOTE — ED Notes (Addendum)
Old and new EKG given to MD, copies placed in chart. 

## 2012-03-13 ENCOUNTER — Inpatient Hospital Stay (HOSPITAL_COMMUNITY): Payer: PRIVATE HEALTH INSURANCE

## 2012-03-13 ENCOUNTER — Encounter (HOSPITAL_COMMUNITY): Payer: Self-pay | Admitting: *Deleted

## 2012-03-13 DIAGNOSIS — E46 Unspecified protein-calorie malnutrition: Secondary | ICD-10-CM | POA: Diagnosis present

## 2012-03-13 DIAGNOSIS — I509 Heart failure, unspecified: Secondary | ICD-10-CM

## 2012-03-13 DIAGNOSIS — M7989 Other specified soft tissue disorders: Secondary | ICD-10-CM

## 2012-03-13 LAB — COMPREHENSIVE METABOLIC PANEL
Alkaline Phosphatase: 41 U/L (ref 39–117)
BUN: 22 mg/dL (ref 6–23)
Chloride: 93 mEq/L — ABNORMAL LOW (ref 96–112)
Creatinine, Ser: 0.77 mg/dL (ref 0.50–1.35)
GFR calc Af Amer: >90 mL/min (ref >90)
GFR calc non Af Amer: 82 mL/min — ABNORMAL LOW (ref >90)
Glucose, Bld: 92 mg/dL (ref 70–99)
Potassium: 3.2 mEq/L — ABNORMAL LOW (ref 3.5–5.1)
Total Bilirubin: 0.9 mg/dL (ref 0.3–1.2)

## 2012-03-13 LAB — BLOOD GAS, ARTERIAL
Bicarbonate: 38.9 mEq/L — ABNORMAL HIGH (ref 20.0–24.0)
O2 Content: 2 L/min
Patient temperature: 98.6
pH, Arterial: 7.39 (ref 7.350–7.450)

## 2012-03-13 LAB — CBC
Hemoglobin: 8.1 g/dL — ABNORMAL LOW (ref 13.0–17.0)
MCH: 22.9 pg — ABNORMAL LOW (ref 26.0–34.0)
RBC: 3.54 MIL/uL — ABNORMAL LOW (ref 4.22–5.81)
WBC: 7.1 10*3/uL (ref 4.0–10.5)

## 2012-03-13 LAB — BASIC METABOLIC PANEL
CO2: 42 mEq/L (ref 19–32)
Calcium: 9.3 mg/dL (ref 8.4–10.5)
Chloride: 93 mEq/L — ABNORMAL LOW (ref 96–112)
Sodium: 139 mEq/L (ref 135–145)

## 2012-03-13 LAB — MAGNESIUM: Magnesium: 1.9 mg/dL (ref 1.5–2.5)

## 2012-03-13 LAB — CREATININE, URINE, RANDOM: Creatinine, Urine: 20.79 mg/dL

## 2012-03-13 LAB — GLUCOSE, CAPILLARY
Glucose-Capillary: 94 mg/dL (ref 70–99)
Glucose-Capillary: 90 mg/dL (ref 70–99)

## 2012-03-13 MED ORDER — WARFARIN SODIUM 5 MG PO TABS
5.0000 mg | ORAL_TABLET | Freq: Once | ORAL | Status: AC
  Administered 2012-03-13: 5 mg via ORAL
  Filled 2012-03-13: qty 1

## 2012-03-13 MED ORDER — PANTOPRAZOLE SODIUM 40 MG PO TBEC
40.0000 mg | DELAYED_RELEASE_TABLET | Freq: Two times a day (BID) | ORAL | Status: DC
  Administered 2012-03-13 – 2012-03-24 (×21): 40 mg via ORAL
  Filled 2012-03-13 (×21): qty 1

## 2012-03-13 MED ORDER — POTASSIUM CHLORIDE CRYS ER 20 MEQ PO TBCR
40.0000 meq | EXTENDED_RELEASE_TABLET | Freq: Once | ORAL | Status: AC
  Administered 2012-03-13: 40 meq via ORAL

## 2012-03-13 MED ORDER — MAGNESIUM SULFATE 40 MG/ML IJ SOLN
2.0000 g | Freq: Once | INTRAMUSCULAR | Status: AC
  Administered 2012-03-13: 2 g via INTRAVENOUS
  Filled 2012-03-13: qty 50

## 2012-03-13 MED ORDER — CHLORHEXIDINE GLUCONATE 0.12 % MT SOLN
15.0000 mL | Freq: Two times a day (BID) | OROMUCOSAL | Status: DC
  Administered 2012-03-13 (×3): 15 mL via OROMUCOSAL
  Filled 2012-03-13 (×4): qty 15

## 2012-03-13 MED ORDER — SODIUM CHLORIDE 0.9 % IV SOLN
INTRAVENOUS | Status: DC
Start: 2012-03-13 — End: 2012-03-24
  Administered 2012-03-13: via INTRAVENOUS
  Administered 2012-03-14: 20 mL/h via INTRAVENOUS
  Administered 2012-03-20 – 2012-03-23 (×2): via INTRAVENOUS

## 2012-03-13 MED ORDER — BIOTENE DRY MOUTH MT LIQD: 15.0000 mL | Freq: Two times a day (BID) | OROMUCOSAL | Status: DC

## 2012-03-13 MED ORDER — WARFARIN - PHARMACIST DOSING INPATIENT
Freq: Every day | Status: DC
  Administered 2012-03-17: 18:00:00

## 2012-03-13 MED ORDER — BIOTENE DRY MOUTH MT LIQD
15.0000 mL | Freq: Two times a day (BID) | OROMUCOSAL | Status: DC
  Administered 2012-03-14 – 2012-03-24 (×20): 15 mL via OROMUCOSAL

## 2012-03-13 MED ORDER — FUROSEMIDE 10 MG/ML IJ SOLN
120.0000 mg | Freq: Four times a day (QID) | INTRAVENOUS | Status: DC
  Administered 2012-03-13 – 2012-03-21 (×30): 120 mg via INTRAVENOUS
  Filled 2012-03-13 (×35): qty 12

## 2012-03-13 MED ORDER — METOPROLOL TARTRATE 1 MG/ML IV SOLN
5.0000 mg | Freq: Once | INTRAVENOUS | Status: AC
  Administered 2012-03-13: 5 mg via INTRAVENOUS
  Filled 2012-03-13: qty 5

## 2012-03-13 MED ORDER — SODIUM CHLORIDE 0.9 % IJ SOLN
10.0000 mL | INTRAMUSCULAR | Status: DC | PRN
  Administered 2012-03-18 – 2012-03-21 (×5): 10 mL
  Administered 2012-03-21: 20 mL
  Administered 2012-03-21 – 2012-03-22 (×4): 10 mL
  Administered 2012-03-23: 20 mL
  Administered 2012-03-23 – 2012-03-24 (×2): 10 mL

## 2012-03-13 MED ORDER — WARFARIN VIDEO: Freq: Once | Status: DC

## 2012-03-13 MED ORDER — SODIUM CHLORIDE 0.9 % IV SOLN
INTRAVENOUS | Status: DC
  Administered 2012-03-13: via INTRAVENOUS
  Administered 2012-03-14: 20 mL/h via INTRAVENOUS

## 2012-03-13 MED ORDER — HEPARIN (PORCINE) IN NACL 100-0.45 UNIT/ML-% IJ SOLN
1450.0000 [IU]/h | INTRAMUSCULAR | Status: DC
  Administered 2012-03-13 – 2012-03-14 (×3): 1450 [IU]/h via INTRAVENOUS
  Filled 2012-03-13 (×2): qty 250

## 2012-03-13 MED ORDER — PATIENT'S GUIDE TO USING COUMADIN BOOK
Freq: Once | Status: AC
  Administered 2012-03-13: 1
  Filled 2012-03-13: qty 1

## 2012-03-13 MED ORDER — ACETAZOLAMIDE SODIUM 500 MG IJ SOLR
500.0000 mg | Freq: Once | INTRAMUSCULAR | Status: AC
  Administered 2012-03-13: 500 mg via INTRAVENOUS
  Filled 2012-03-13: qty 500

## 2012-03-13 MED ORDER — SODIUM CHLORIDE 0.9 % IJ SOLN
10.0000 mL | Freq: Two times a day (BID) | INTRAMUSCULAR | Status: DC
  Administered 2012-03-13 – 2012-03-14 (×2): 10 mL
  Administered 2012-03-15: 30 mL
  Administered 2012-03-16: 12 mL
  Administered 2012-03-19: 10 mL
  Administered 2012-03-20: 30 mL
  Administered 2012-03-20 – 2012-03-23 (×2): 10 mL

## 2012-03-13 NOTE — Progress Notes (Signed)
Paged Dr. Malachi Bonds regarding upper extremity being POSITIVE for DVT.  Awaiting call back.  Salomon Mast, RN

## 2012-03-13 NOTE — Progress Notes (Signed)
CRITICAL VALUE ALERT  Critical value received:  PCO2, arterial: 65.7  Date of notification:  03/13/12  Time of notification:  1840  Critical value read back:yes  Nurse who received alert:  Nicole Cella, RN  MD notified (1st page):  Dr. Maryruth Hancock person  Time of first page: 1640

## 2012-03-13 NOTE — Progress Notes (Signed)
*  Preliminary Results* Left upper extremity venous duplex completed. Left upper extremity is positive for deep vein thrombosis. Preliminary results discussed with RN Morrie Sheldon. RN will inform MD.  03/13/2012 2:18 PM Gertie Fey, RDMS, RDCS

## 2012-03-13 NOTE — Progress Notes (Signed)
EKG results called to Elray Mcgregor, NP

## 2012-03-13 NOTE — Progress Notes (Signed)
[facility] PROGRESS NOTE  BAPTISTE LITTLER ZOX:[id number] DOB: 1927-03-08 DOA: 03/12/2012 PCP: Oliver Barre, MD  Assessment/Plan: Principal Problem:  *Acute exacerbation of congestive heart failure Active Problems:  DIABETES MELLITUS, TYPE II  ANXIETY  HYPERTENSION  PERIPHERAL VASCULAR DISEASE  AV block, 2nd degree  Weakness  Anemia  Pleural effusion  Respiratory failure with hypercapnia  Acute kidney injury  Infected decubitus ulcer  Protein malnutrition  The patient is an 78 year old male with CAD, severe PVD, 2nd degree heart block s/p pacemaker placement, CHF, DVT of left arm, anemia, and 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 Patient presents with respiratory failure likely due to CHF exacerbation, unknown trigger, although his lasix may have been weaned from twice daily to once daily over the last month.   CHF exacerbation with bilateral pleural effusions and hypercapneic respiratory failure. The patient is followed by Dr. Verdis Prime at [facility] and his medications for CHF are titrated at his nursing home. He has recently been using once daily lasix and twice daily spironolactone, which is administered by the NH. His diet is low-salt. Exacerbation may be due to ACS, however, EKG stable and troponin negative. He may also have a degree of obstructive sleep apnea or obesity hypoventilation syndrome complicating the picture, however, he does not use CPAP/BiPAP. He does not have sputum production or fevers suggestive of pneumonia. His elevated bicarb may be due to progressive respiratory failure with renal compensation over the course of the last week. What is remarkable is that the patient's last ECHO shows a relatively preserved EF of 45-50%.  Patient had arrhythmia overnight that responded to beta-blocker, likely exacerbated by dobutamine so will decrease today. - Strict I/Os  - Daily weights  - Restart diet - Goal of -2-3 L - Lasix 120mg  IV q6h    - BMP with Magnesium q12h during aggressive diuresis - Avoid metolazone secondary to sulfa allergy  - Dobutamine 2.5mg /kg/h  - Appreciate Cardiology recommendations -  Consider reapplication of bipap overnight.  CAD (hypokinesis of basal-midinferior myocardium in setting of severe PVD), HTN, HL  - On ASA  - Consider adding low-dose beta-blocker pending d/c of dobutamine gtt  - Holding statin and fibrate due to reported allergy and renal failure.   Bilateral pleural effusions, likely secondary to CHF exacerbation and transudative, however, may be contributing the respiratory distress. Patient appears to be improving with diuresis  - Consider thoracentesis if respiratory distress does not improve.   Acute kidney injury, may be cardiorenal, but consider obstruction also.  Only in bladder when foley catheter placed.  Will plan to DC in AM and use condom cath versus urinal for strict I/Os.  FEurea 33% (mixed) - Renally dosed medications  - Avoid nephrotoxic agents   Left arm swelling with history of previous DVT, off AC.  DVT positive on Duplex today. - Continue Heparin gtt while AKI resolves today and consider lovenox tomorrow - Start warfarin - Monitor closely for signs of bleeding given recent GIB and biopsy   Abdominal discomfort, likely due to gut edema, urinary retention, but also consider cardiac ascites +/- SBP (less likely), or mesenteric ischemia in a vasculopath (less likely given degree and timing of symptoms) .  [place] positive for ascites -  Diagnostic paracentesis - Diuresis as above   Left leg ulcer with purulent drainage concerning for infection, however no fever or white count and no gross cellulitis  - Wound care consult  - Start broad spectrum abx if appears to  worsen   Diabetes, fingerstick 108 upon arrival in ER  - DC lantus 5 units as fingersticks well controlled without previous days dose in setting of resolving AKI. - SSI low-coverage   Anemia, stable from  prior. Likely not contributing to dyspnea  - Monitor for now   GERD  - Continue PPI but increase to BID since starting A/C after recent procedure.  DIET:  Diabetic diet  ACCESS:  PICC PROPH: Heparin gtt + coumadin + increase PPI to BID   Code Status: Full code Family Communication: Spoke with family at arrival to ER Disposition Plan:  Pending stable respiratory status after adequate diuresis, plan for anticoagulation outpatient.   Brief narrative:  The patient is an 78 year old male with history of CAD s/p MI without CABG or stent. He had worsening CHF with 2nd degree 2:1 heart block requiring pacemaker placement 12/03/2011. He also has diabetes, PVD s/p right AKA, left calf ulcer.  The patient was at his baseline with 2L home oxygen about 5 days ago. He has been becoming progressively more Salvadore Valvano of breath. Last night, he was lifted via [place] lift and when he was placed back in bed, he felt very Aastha Dayley of breath and may have had an episode of syncope. He turned cyanotic and they turned up his oxygen. He had a CXR in the morning ordered by the nurse practitioner at the [facility] nursing home where he stays and it showed pulmonary edema and pleural effusions. He was transported to the [facility] for further evalaution. He has had a nonproductive cough, worsening swelling of the left arm, and a new fluid blister of the left leg which burst recently. He feels like drowning when he lies back. Dyspnea without exertion.  Per history, he has had a possible DVT in the left arm previously which was treated with coumadin. He has not been on coumadin for several weeks due to his GI bleed and because he recently underwent EGD with biopsy on 02/27/12.    Consultants:  Cardiology  Procedures:  Abd [place] 8/18  PICC line 8/18  Antibiotics:  None  HPI/Subjective:  Patient feels better and able to communicate while using nasal canula.  States he feels thirsty and hungry, but denies nausea,  vomiting, diarrhea.  No BM overnight.  Breathing he states is improved.  Denies chest pain and palpitations.     Objective: Filed Vitals:   03/13/12 1100 03/13/12 1204 03/13/12 1300 03/13/12 1538  BP: 114/68 102/49  122/59  Pulse: 73 73 77 75  Temp:  97.7 F (36.5 C)  97.7 F (36.5 C)  TempSrc:  Axillary  Axillary  Resp: 21 20 24 27   Height:      Weight:      SpO2: 96% 96% 97% 99%    Intake/Output Summary (Last 24 hours) at 03/13/12 1737 Last data filed at 03/13/12 1700  Gross per 24 hour  Intake  842.2 ml  Output   2025 ml  Net -1182.8 ml   Filed Weights   03/12/12 2005 03/12/12 2251 03/13/12 0520  Weight: 88.9 kg (195 lb 15.8 oz) 88.9 kg (195 lb 15.8 oz) 85.8 kg (189 lb 2.5 oz)    Exam:   General:  Obese caucasian male, mild respiratory distress with tachypnea without accessory muscle use  HEENT:  Nares clear, MMM  Neck:  JVP to the tragus while seated upright  Cardiovascular:  RRR, 1/6 systolic murmur at the LSB, S1, ps S2  Respiratory: Rales to apices  Abdomen: NABS,  distended with mild fluid wave, tender to palpation along midline and suprapubic region without rebound or guarding  EXT:  Slow 3+ pitting edema all extremities, particularly buttucks, legs, and dependent side of arms  Skin:  Removed duoderm this morning revealing 3 ares of stage II sacral and buttock ulcers.  Data Reviewed: Basic Metabolic Panel:  Lab 03/13/12 1610 03/13/12 0456 03/12/12 1553  NA -- 139 137  K -- 3.2* 3.9  CL -- 93* 92*  CO2 -- 38* 39*  GLUCOSE -- 92 107*  BUN -- 22 24*  CREATININE -- 0.77 0.87  CALCIUM -- 9.2 9.8  MG 1.5 -- --  PHOS -- -- --   Liver Function Tests:  Lab 03/13/12 0456 03/12/12 1553  AST 30 34  ALT 9 11  ALKPHOS 41 47  BILITOT 0.9 0.9  PROT 6.4 7.3  ALBUMIN 2.5* 2.9*   No results found for this basename: LIPASE:5,AMYLASE:5 in the last 168 hours No results found for this basename: AMMONIA:5 in the last 168 hours CBC:  Lab 03/13/12 0456  03/12/12 1553  WBC 7.1 8.0  NEUTROABS -- --  HGB 8.1* 8.9*  HCT 26.7* 30.3*  MCV 75.4* 76.7*  PLT 197 243   Cardiac Enzymes:  Lab 03/12/12 1552  CKTOTAL --  CKMB --  CKMBINDEX --  TROPONINI <0.30   BNP (last 3 results)  Basename 03/12/12 1552 12/04/11 1053  PROBNP 23086.0* 6095.0*   CBG:  Lab 03/13/12 1717 03/13/12 1203 03/13/12 0757 03/13/12 0051 03/12/12 1705  GLUCAP 115* 90 97 94 94    Recent Results (from the past 240 hour(s))  MRSA PCR SCREENING     Status: Normal   Collection Time   03/12/12 11:12 PM      Component Value Range Status Comment   MRSA by PCR NEGATIVE  NEGATIVE Final      Studies: US Abdomen Limited  03/13/2012  *RADIOLOGY REPORT*  Clinical Data: Evaluate for ascites.  LIMITED ABDOMEN ULTRASOUND FOR ASCITES  Technique:  Limited ultrasound survey for ascites was performed in all four abdominal quadrants.  Comparison:  None.  Findings: Small to moderate volume abdominal and pelvic ascites. Incidental note is made of bilateral pleural effusions.  Liver not well evaluated.  IMPRESSION:  1.  Small to moderate volume abdominal pelvic ascites. 2.  Bilateral pleural effusions.  Original Report Authenticated By: Consuello Bossier, M.D.   Dg Chest Port 1 View  03/13/2012  *RADIOLOGY REPORT*  Clinical Data: Line placement.  PORTABLE CHEST - 1 VIEW  Comparison: 1 day prior  Findings: A right-sided PICC line followed to the level of the high right atrium. Pacer with leads at right atrium and right ventricle. No lead discontinuity.  No pneumothorax.  Moderate cardiomegaly.  Layering bilateral pleural effusions are not significantly changed.  Low lung volumes with slight improvement in mild interstitial edema.  Bibasilar airspace disease is similar.  IMPRESSION:  1.  Right-sided PICC line terminating over the high right atrium. If mid to low SVC position is desired, recommend retraction 5.8 cm. 2.  Slight improvement in mild congestive heart failure. 3.  Similar bilateral  pleural effusions and bibasilar airspace disease, likely atelectasis.  Original Report Authenticated By: Consuello Bossier, M.D.   Dg Chest Portable 1 View  03/12/2012  *RADIOLOGY REPORT*  Clinical Data: 78 year old male with shortness of breath, swelling.  PORTABLE CHEST - 1 VIEW  Comparison: 12/05/2011 and earlier.  Findings: Portable semi upright AP view at 1617 hours.  More lordotic view. Stable  cardiomegaly and mediastinal contours. Stable left chest cardiac pacemaker.  Increase veiling opacity on the right.  No pneumothorax.  Stable vascular congestion, no overt pulmonary edema.  Continued bibasilar opacity.  IMPRESSION: Interval increased right pleural effusion, probably moderate. Stable vascular congestion and basilar atelectasis.  Original Report Authenticated By: Harley Hallmark, M.D.    Scheduled Meds:   . antiseptic oral rinse  15 mL Mouth Rinse BID  . aspirin EC  81 mg Oral Daily  . chlorhexidine  15 mL Mouth/Throat BID  . docusate sodium  100 mg Oral BID  . dutasteride  0.5 mg Oral Daily  . FLUoxetine  30 mg Oral Daily  . fluticasone  2 spray Each Nare Daily  . furosemide  120 mg Intravenous Q8H  . heparin  3,000 Units Intravenous Once  . insulin aspart  0-5 Units Subcutaneous QHS  . insulin aspart  0-9 Units Subcutaneous TID WC  . insulin glargine  5 Units Subcutaneous QHS  . iron polysaccharides  150 mg Oral Daily  . lidocaine  1 patch Transdermal Daily  . loratadine  5 mg Oral Daily  . magnesium sulfate 1 - 4 g bolus IVPB  2 g Intravenous Once  . metoprolol  5 mg Intravenous Once  . multivitamin with minerals  1 tablet Oral Daily  . nitroGLYCERIN  1 inch Topical Q6H  . olopatadine  1 drop Both Eyes BID  . pantoprazole  40 mg Oral Q1200  . patient's guide to using coumadin book   Does not apply Once  . polyethylene glycol  17 g Oral Daily  . potassium chloride  40 mEq Oral Once  . senna-docusate  1 tablet Oral QHS  . sodium chloride  10-40 mL Intracatheter Q12H  . sodium  chloride  3 mL Intravenous Q12H  . spironolactone  25 mg Oral BID  . warfarin  5 mg Oral ONCE-1800  . warfarin   Does not apply Once  . Warfarin - Pharmacist Dosing Inpatient   Does not apply q1800  . zolpidem  5 mg Oral QHS  . DISCONTD: antiseptic oral rinse  15 mL Mouth Rinse q12n4p  . DISCONTD: loratadine  5 mg Oral Daily  . DISCONTD: potassium chloride SA  20 mEq Oral Daily   Continuous Infusions:   . sodium chloride 10 mL/hr at 03/13/12 0017  . sodium chloride 10 mL/hr at 03/13/12 0022  . DOBUTamine 2.5 mcg/kg/min (03/13/12 0820)  . heparin 1,450 Units/hr (03/13/12 1533)  . DISCONTD: heparin 1,300 Units/hr (03/13/12 0028)    Principal Problem:  *Acute exacerbation of congestive heart failure Active Problems:  DIABETES MELLITUS, TYPE II  ANXIETY  HYPERTENSION  PERIPHERAL VASCULAR DISEASE  AV block, 2nd degree  Weakness  Anemia  Pleural effusion  Respiratory failure with hypercapnia  Acute kidney injury  Infected decubitus ulcer  Protein malnutrition    Time spent: 45    Rozina Pointer, [facility]  [facility] Pager [phone]. If 8PM-8AM, please contact night-coverage at [email or web address], password [facility] 03/13/2012, 5:37 PM  LOS: 1 day

## 2012-03-13 NOTE — Progress Notes (Signed)
Paged Dr. Malachi Bonds regarding blood draw for heparin level.  Orders were received that it is ok to draw heparin level from right hand (where PICC line is running heparin).    Salomon Mast, RN

## 2012-03-13 NOTE — Progress Notes (Signed)
Pt having some tenderness with palpation in Left upper quadrant of abdomen as well as pt having no diet order in place.  Short, MD notified.  Orders were received.  Salomon Mast, RN

## 2012-03-13 NOTE — Consult Note (Signed)
CARDIOLOGY CONSULT NOTE    Patient ID: GOKUL WAYBRIGHT MRN: [id number] DOB/AGE: 09/23/1926 78 y.o.  Admit date: 03/12/2012 Referring Physician:  Short Primary Physician: Oliver Barre, MD Primary Cardiologist: Carolan Shiver Reason for Consultation: CHF  Principal Problem:  *Acute exacerbation of congestive heart failure Active Problems:  DIABETES MELLITUS, TYPE II  ANXIETY  HYPERTENSION  PERIPHERAL VASCULAR DISEASE  AV block, 2nd degree  Weakness  Anemia  Pleural effusion  Respiratory failure with hypercapnia  Acute kidney injury  Infected decubitus ulcer  Protein malnutrition   HPI:  Debilitated 78 yo patient of Dr Carolan Shiver and [facility].  Admitted with CHF exacerbation from [facility].  Distant history of CABG.  Pacer this year for heart block.  EF has been in 45% range.  Unable to do ADL;s Has previous R AKA from PVD.  ? LUE DVT with chronic swelling.  Not on coumadin now due to recent GI bleed and biopsy.  The patient was at his baseline with 2L home oxygen about 5 days ago. He has been becoming progressively more short of breath. Last night, he was lifted via [place] lift and when he was placed back in bed, he felt very short of breath and may have had an episode of syncope. He turned cyanotic and they turned up his oxygen. He had a CXR in the morning ordered by the nurse practitioner at the [facility] nursing home where he stays and it showed pulmonary edema and pleural effusions. He was transported to the [facility] for further evalaution. He has had a nonproductive cough, worsening swelling of the left arm, and a new fluid blister of the left leg which burst recently.  Currently getting bedside echo   @ROS @ All other systems reviewed and negative except as noted above  Past Medical History  Diagnosis Date  . Arthritis   . Diabetes mellitus   . Anemia   . PVD (peripheral vascular disease)   . ICD (implantable cardiac defibrillator) in place   .  Shortness of breath   . Coronary artery disease   . Myocardial infarction   . Anxiety   . Pacemaker   . CHF (congestive heart failure)     Family History  Problem Relation Age of Onset  . Coronary artery disease Father   . Stroke Father   . Coronary artery disease Brother   . Cancer Mother   . Diabetes Father   . Diabetes Brother     History   Social History  . Marital Status: Widowed    Spouse Name: N/A    Number of Children: N/A  . Years of Education: N/A   Occupational History  . Not on file.   Social History Main Topics  . Smoking status: Former Smoker -- 1.5 packs/day    Types: Cigarettes    Quit date: 01/08/1959  . Smokeless tobacco: Not on file  . Alcohol Use: No  . Drug Use: No  . Sexually Active: No   Other Topics Concern  . Not on file   Social History Narrative  . No narrative on file    Past Surgical History  Procedure Date  . Shoulder surgery   . Leg amputation through knee 2006  . Hernia repair   . Total hip arthroplasty     right side  . Esophagogastroduodenoscopy 01/08/2012    Procedure: ESOPHAGOGASTRODUODENOSCOPY (EGD);  Surgeon: Theda Belfast, MD;  Location: [facility];  Service: Endoscopy;  Laterality: N/A;  . Esophagogastroduodenoscopy  02/26/2012    Procedure: ESOPHAGOGASTRODUODENOSCOPY (EGD);  Surgeon: Theda Belfast, MD;  Location: [facility];  Service: Endoscopy;  Laterality: N/A;  pt is wheelchair bound  . Balloon dilation 02/26/2012    Procedure: BALLOON DILATION;  Surgeon: Theda Belfast, MD;  Location: [facility];  Service: Endoscopy;  Laterality: N/A;  . Insert / replace / remove pacemaker         . antiseptic oral rinse  15 mL Mouth Rinse BID  . aspirin EC  81 mg Oral Daily  . chlorhexidine  15 mL Mouth/Throat BID  . docusate sodium  100 mg Oral BID  . dutasteride  0.5 mg Oral Daily  . FLUoxetine  30 mg Oral Daily  . fluticasone  2 spray Each Nare Daily  . furosemide  120 mg Intravenous Q8H  . furosemide  80 mg  Intravenous Once  . heparin  3,000 Units Intravenous Once  . insulin aspart  0-5 Units Subcutaneous QHS  . insulin aspart  0-9 Units Subcutaneous TID WC  . insulin glargine  5 Units Subcutaneous QHS  . iron polysaccharides  150 mg Oral Daily  . lidocaine  1 patch Transdermal Daily  . loratadine  5 mg Oral Daily  . magnesium sulfate 1 - 4 g bolus IVPB  2 g Intravenous Once  . metoprolol  5 mg Intravenous Once  . multivitamin with minerals  1 tablet Oral Daily  . nitroGLYCERIN  1 inch Topical Q6H  . olopatadine  1 drop Both Eyes BID  . pantoprazole  40 mg Oral Q1200  . polyethylene glycol  17 g Oral Daily  . potassium chloride SA  20 mEq Oral Daily  . potassium chloride  40 mEq Oral Once  . senna-docusate  1 tablet Oral QHS  . sodium chloride  3 mL Intravenous Q12H  . spironolactone  25 mg Oral BID  . zolpidem  5 mg Oral QHS  . DISCONTD: antiseptic oral rinse  15 mL Mouth Rinse q12n4p  . DISCONTD: loratadine  5 mg Oral Daily      . sodium chloride 10 mL/hr at 03/13/12 0017  . sodium chloride 10 mL/hr at 03/13/12 0022  . DOBUTamine 2.5 mcg/kg/min (03/13/12 0820)  . heparin 1,450 Units/hr (03/13/12 0543)  . DISCONTD: heparin 1,300 Units/hr (03/13/12 0028)    Physical Exam: Blood pressure 108/40, pulse 75, temperature 98 F (36.7 C), temperature source Oral, resp. rate 22, height 5\' 8"  (1.727 m), weight 189 lb 2.5 oz (85.8 kg), SpO2 94.00%.   Affect appropriate Chronically ill white male HEENT: normal Neck supple with no adenopathy JVP elevated  no bruits no thyromegaly Lungs poor inspitory effort dullness both bases and rales  Heart:  S1/S2 no murmur, no rub, gallop or click PMI normal Abdomen: benighn, BS positve, no tenderness, no AAA no bruit.  No HSM or HJR Distal pulses poor with R AKA Edema in both arms left greater than right and LLE Neuro non-focal Skin warm and dry ulcer wrapped on left calf No muscular weakness   Labs:   Lab Results  Component Value Date    WBC 7.1 03/13/2012   HGB 8.1* 03/13/2012   HCT 26.7* 03/13/2012   MCV 75.4* 03/13/2012   PLT 197 03/13/2012    Lab 03/13/12 0456  NA 139  K 3.2*  CL 93*  CO2 38*  BUN 22  CREATININE 0.77  CALCIUM 9.2  PROT 6.4  BILITOT 0.9  ALKPHOS 41  ALT 9  AST 30  GLUCOSE 92  Lab Results  Component Value Date   CKTOTAL 3579* 02/10/2008   CKMB 44.1* 02/09/2008   TROPONINI <0.30 03/12/2012    Lab Results  Component Value Date   CHOL 245* 03/13/2008   CHOL  Value: 147        ATP III CLASSIFICATION:  <200     mg/dL   Desirable  782-956  mg/dL   Borderline High  >=213    mg/dL   High 0/86/5784   CHOL 151 09/13/2007   Lab Results  Component Value Date   HDL 36.8* 03/13/2008   HDL 43 01/11/2008   HDL 42.2 09/13/2007   Lab Results  Component Value Date   LDLCALC  Value: 80        Total Cholesterol/HDL:CHD Risk Coronary Heart Disease Risk Table                     Men   Women  1/2 Average Risk   3.4   3.3 01/11/2008   LDLCALC 75 09/13/2007   LDLCALC 67 06/14/2007   Lab Results  Component Value Date   TRIG 274* 03/13/2008   TRIG 118 01/11/2008   TRIG 167* 09/13/2007   Lab Results  Component Value Date   CHOLHDL 6.7 CALC 03/13/2008   CHOLHDL 3.4 01/11/2008   CHOLHDL 3.6 CALC 09/13/2007   Lab Results  Component Value Date   LDLDIRECT 160.2 03/13/2008   LDLDIRECT 139.5 09/07/2006      Radiology: US Abdomen Limited  03/13/2012  *RADIOLOGY REPORT*  Clinical Data: Evaluate for ascites.  LIMITED ABDOMEN ULTRASOUND FOR ASCITES  Technique:  Limited ultrasound survey for ascites was performed in all four abdominal quadrants.  Comparison:  None.  Findings: Small to moderate volume abdominal and pelvic ascites. Incidental note is made of bilateral pleural effusions.  Liver not well evaluated.  IMPRESSION:  1.  Small to moderate volume abdominal pelvic ascites. 2.  Bilateral pleural effusions.  Original Report Authenticated By: Consuello Bossier, M.D.   Dg Chest Portable 1 View  03/12/2012  *RADIOLOGY  REPORT*  Clinical Data: 78 year old male with shortness of breath, swelling.  PORTABLE CHEST - 1 VIEW  Comparison: 12/05/2011 and earlier.  Findings: Portable semi upright AP view at 1617 hours.  More lordotic view. Stable cardiomegaly and mediastinal contours. Stable left chest cardiac pacemaker.  Increase veiling opacity on the right.  No pneumothorax.  Stable vascular congestion, no overt pulmonary edema.  Continued bibasilar opacity.  IMPRESSION: Interval increased right pleural effusion, probably moderate. Stable vascular congestion and basilar atelectasis.  Original Report Authenticated By: Ulla Potash III, M.D.    EKG: NSR RBBB LAD PVC no actue ischemic changes   ASSESSMENT AND PLAN:  CHF:  Preliminary echo with similar EF.  Essentially bedridden with low albumin.  Continue iv lasix and aldactone Supple K Enzymes:  More consistant with rhabdo with imobility  Troponin negative and no chest pain Anemia:  Recent EGD with polyps and biopsy.  Indicated holding coumadin for 4 weeks (03/28/12)  At risk for DVT and repeat LUE Duplex pending.  May need to consider reanticoagulation sooner with transfusion if positive CAD:  Stable no ECG changes and no chest pain.  Non cardiac elevation in CPK Heart Block:  S/P pacer  PVD:  S/P right AKA skin care consult for LLE  Signed: Charlton Haws 03/13/2012, 10:46 AM

## 2012-03-13 NOTE — Progress Notes (Signed)
ANTICOAGULATION CONSULT NOTE - Follow Up Consult  Pharmacy Consult for Heaprin Indication: Suspected UE thrombosis  Allergies  Allergen Reactions  . Sulfa Antibiotics Rash  . Atorvastatin   . Penicillins   . Simvastatin     Patient Measurements: Height: 5\' 8"  (172.7 cm) Weight: 189 lb 2.5 oz (85.8 kg) IBW/kg (Calculated) : 68.4   Vital Signs: Temp: 97.7 F (36.5 C) (08/18 1204) Temp src: Axillary (08/18 1204) BP: 102/49 mmHg (08/18 1204) Pulse Rate: 77  (08/18 1300)  Labs:  Basename 03/13/12 1410 03/13/12 0456 03/12/12 1553 03/12/12 1552  HGB -- 8.1* 8.9* --  HCT -- 26.7* 30.3* --  PLT -- 197 243 --  APTT -- -- 32 --  LABPROT -- -- 16.4* --  INR -- -- 1.30 --  HEPARINUNFRC 0.31 0.24* -- --  CREATININE -- 0.77 0.87 --  CKTOTAL -- -- -- --  CKMB -- -- -- --  TROPONINI -- -- -- <0.30    Estimated Creatinine Clearance: 74.6 ml/min (by C-G formula based on Cr of 0.77).   Assessment: 85yom NH resident admitted for increased SOB.  Found to have LUE swelling and worrisome for VTE.  Heaprin drip started.  Initial HL 0.24 heparin drip increased to 1450 uts/hr.  Recheck of HL 0.31 at goal 0.3-0.7 and no bleeding noted. Goal of Therapy:  Heparin level 0.3-0.7 units/ml Monitor platelets by anticoagulation protocol: Yes   Plan:  Continue Heparin drip 1450 uts/hr Daily CBC, HL  [name] 03/13/2012,3:23 PM

## 2012-03-13 NOTE — Progress Notes (Signed)
Dr. Malachi Bonds came up to the unit.  New orders received, as well as family and pt updated by MD.  Salomon Mast, RN

## 2012-03-13 NOTE — Progress Notes (Signed)
Spoke with IV Team RN and confirmed that pt is on the list for a PICC line

## 2012-03-13 NOTE — Progress Notes (Signed)
Dr. Malachi Bonds aware of DVT positive in upper extremity.  Orders were received.  Salomon Mast, RN

## 2012-03-13 NOTE — Progress Notes (Signed)
ANTICOAGULATION CONSULT NOTE - Initial Consult  Pharmacy Consult for coumadin Indication: UE DVT  Allergies  Allergen Reactions  . Sulfa Antibiotics Rash  . Atorvastatin   . Penicillins   . Simvastatin     Patient Measurements: Height: 5\' 8"  (172.7 cm) Weight: 189 lb 2.5 oz (85.8 kg) IBW/kg (Calculated) : 68.4    Vital Signs: Temp: 97.7 F (36.5 C) (08/18 1538) Temp src: Axillary (08/18 1538) BP: 122/59 mmHg (08/18 1538) Pulse Rate: 75  (08/18 1538)  Labs:  Basename 03/13/12 1410 03/13/12 0456 03/12/12 1553 03/12/12 1552  HGB -- 8.1* 8.9* --  HCT -- 26.7* 30.3* --  PLT -- 197 243 --  APTT -- -- 32 --  LABPROT -- -- 16.4* --  INR -- -- 1.30 --  HEPARINUNFRC 0.31 0.24* -- --  CREATININE -- 0.77 0.87 --  CKTOTAL -- -- -- --  CKMB -- -- -- --  TROPONINI -- -- -- <0.30    Estimated Creatinine Clearance: 74.6 ml/min (by C-G formula based on Cr of 0.77).   Medical History: Past Medical History  Diagnosis Date  . Arthritis   . Diabetes mellitus   . Anemia   . PVD (peripheral vascular disease)   . ICD (implantable cardiac defibrillator) in place   . Shortness of breath   . Coronary artery disease   . Myocardial infarction   . Anxiety   . Pacemaker   . CHF (congestive heart failure)      Assessment: 78 yo man to start coumadin for new LUE DVT.  Today is day 1/5 of minimum overlap for VTE.  Age > 80 and alb 2.5.  He is currently therapeutic on a heparin drip.  Baseline INR = 1.3 Goal of Therapy:  INR 2-3 Monitor platelets by anticoagulation protocol: Yes   Plan:  1. Coumadin 5 mg po x 1 dose 2. Daily INR 3. Coumadin book 4. Coumadin video Herby Abraham, Pharm.D. [phone] 03/13/2012 4:29 PM

## 2012-03-13 NOTE — Progress Notes (Signed)
CRITICAL VALUE ALERT  Critical value received:  CO2 (BMET)  Date of notification:  03/13/12  Time of notification:  1605  Critical value read back:yes  Nurse who received alert:  Morrie Sheldon RN  MD notified (1st page):  M. Short, MD  Time of first page:  1615 (in person)

## 2012-03-13 NOTE — Progress Notes (Signed)
PT Cancellation Note  Treatment cancelled today due to medical issues with patient which prohibited therapy.  Attempted to see patient x3 today.  Patient in procedures x2.  DVT in UE noted.  Will hold PT today.  Ivan [name] 03/13/2012, 4:54 PM [phone]

## 2012-03-13 NOTE — Plan of Care (Signed)
Problem: Consults Goal: Diagnosis - Venous Thromboembolism (VTE) Choose a selection Outcome: Completed/Met Date Met:  03/13/12 DVT (Deep Vein Thrombosis) of the LUE

## 2012-03-13 NOTE — Progress Notes (Signed)
Pt's HR substaining in 140s.  Pt asymtomatic.  Notified Elray Mcgregor, NP and EKG ordered.

## 2012-03-13 NOTE — Progress Notes (Signed)
  Echocardiogram 2D Echocardiogram has been performed.  LONGINO, TREFZ 03/13/2012, 10:52 AM

## 2012-03-14 ENCOUNTER — Inpatient Hospital Stay (HOSPITAL_COMMUNITY): Payer: PRIVATE HEALTH INSURANCE

## 2012-03-14 DIAGNOSIS — J90 Pleural effusion, not elsewhere classified: Secondary | ICD-10-CM

## 2012-03-14 DIAGNOSIS — N179 Acute kidney failure, unspecified: Secondary | ICD-10-CM

## 2012-03-14 DIAGNOSIS — J9600 Acute respiratory failure, unspecified whether with hypoxia or hypercapnia: Secondary | ICD-10-CM

## 2012-03-14 DIAGNOSIS — I498 Other specified cardiac arrhythmias: Secondary | ICD-10-CM

## 2012-03-14 DIAGNOSIS — I739 Peripheral vascular disease, unspecified: Secondary | ICD-10-CM

## 2012-03-14 DIAGNOSIS — I441 Atrioventricular block, second degree: Secondary | ICD-10-CM

## 2012-03-14 LAB — TROPONIN I: Troponin I: <0.3 ng/mL (ref <0.30)

## 2012-03-14 LAB — GLUCOSE, CAPILLARY
Glucose-Capillary: 111 mg/dL — ABNORMAL HIGH (ref 70–99)
Glucose-Capillary: 104 mg/dL — ABNORMAL HIGH (ref 70–99)

## 2012-03-14 LAB — BLOOD GAS, ARTERIAL
Acid-Base Excess: 13.2 mmol/L — ABNORMAL HIGH (ref 0.0–2.0)
Bicarbonate: 40 mEq/L — ABNORMAL HIGH (ref 20.0–24.0)
Bicarbonate: 40.1 mEq/L — ABNORMAL HIGH (ref 20.0–24.0)
Bicarbonate: 40 mEq/L — ABNORMAL HIGH (ref 20.0–24.0)
Delivery systems: POSITIVE
Drawn by: 27407
Drawn by: 27407
Expiratory PAP: 6
FIO2: 40 %
Inspiratory PAP: 14
O2 Saturation: 99.9 %
O2 Saturation: 99.7 %
Patient temperature: 98.6
TCO2: 42.6 mmol/L (ref 0–100)
pCO2 arterial: 86.2 mmHg (ref 35.0–45.0)
pH, Arterial: 7.276 — ABNORMAL LOW (ref 7.350–7.450)
pH, Arterial: 7.295 — ABNORMAL LOW (ref 7.350–7.450)
pO2, Arterial: 125 mmHg — ABNORMAL HIGH (ref 80.0–100.0)

## 2012-03-14 LAB — CBC WITH DIFFERENTIAL/PLATELET
Basophils Absolute: 0 10*3/uL (ref 0.0–0.1)
Basophils Relative: 0 % (ref 0–1)
Eosinophils Absolute: 0 10*3/uL (ref 0.0–0.7)
Eosinophils Relative: 1 % (ref 0–5)
HCT: 26.3 % — ABNORMAL LOW (ref 39.0–52.0)
MCH: 22.8 pg — ABNORMAL LOW (ref 26.0–34.0)
MCHC: 28.9 g/dL — ABNORMAL LOW (ref 30.0–36.0)
Monocytes Absolute: 0.7 10*3/uL (ref 0.1–1.0)
Monocytes Relative: 12 % (ref 3–12)
Neutro Abs: 4.4 10*3/uL (ref 1.7–7.7)
RDW: 17.3 % — ABNORMAL HIGH (ref 11.5–15.5)

## 2012-03-14 LAB — CBC
HCT: 27.6 % — ABNORMAL LOW (ref 39.0–52.0)
Hemoglobin: 8.1 g/dL — ABNORMAL LOW (ref 13.0–17.0)
MCH: 22.8 pg — ABNORMAL LOW (ref 26.0–34.0)
MCH: 23.4 pg — ABNORMAL LOW (ref 26.0–34.0)
MCHC: 29.3 g/dL — ABNORMAL LOW (ref 30.0–36.0)
MCHC: 30.4 g/dL (ref 30.0–36.0)
MCV: 77.5 fL — ABNORMAL LOW (ref 78.0–100.0)
MCV: 76.9 fL — ABNORMAL LOW (ref 78.0–100.0)
Platelets: 195 10*3/uL (ref 150–400)
RDW: 17.2 % — ABNORMAL HIGH (ref 11.5–15.5)

## 2012-03-14 LAB — BASIC METABOLIC PANEL
BUN: 21 mg/dL (ref 6–23)
CO2: 40 mEq/L (ref 19–32)
Chloride: 93 mEq/L — ABNORMAL LOW (ref 96–112)
GFR calc Af Amer: >90 mL/min (ref >90)
Glucose, Bld: 119 mg/dL — ABNORMAL HIGH (ref 70–99)
Potassium: 3.3 mEq/L — ABNORMAL LOW (ref 3.5–5.1)

## 2012-03-14 LAB — LACTIC ACID, PLASMA: Lactic Acid, Venous: 0.5 mmol/L (ref 0.5–2.2)

## 2012-03-14 LAB — COMPREHENSIVE METABOLIC PANEL
AST: 28 U/L (ref 0–37)
Albumin: 2.5 g/dL — ABNORMAL LOW (ref 3.5–5.2)
BUN: 20 mg/dL (ref 6–23)
Calcium: 8.7 mg/dL (ref 8.4–10.5)
Chloride: 96 mEq/L (ref 96–112)
Creatinine, Ser: 0.51 mg/dL (ref 0.50–1.35)
Total Protein: 6.5 g/dL (ref 6.0–8.3)

## 2012-03-14 LAB — MAGNESIUM: Magnesium: 1.8 mg/dL (ref 1.5–2.5)

## 2012-03-14 LAB — PHOSPHORUS: Phosphorus: 4.2 mg/dL (ref 2.3–4.6)

## 2012-03-14 LAB — PROTIME-INR: Prothrombin Time: 18.2 seconds — ABNORMAL HIGH (ref 11.6–15.2)

## 2012-03-14 MED ORDER — WARFARIN SODIUM 5 MG PO TABS
5.0000 mg | ORAL_TABLET | Freq: Once | ORAL | Status: DC
  Filled 2012-03-14: qty 1

## 2012-03-14 MED ORDER — HEPARIN BOLUS VIA INFUSION
2000.0000 [IU] | Freq: Once | INTRAVENOUS | Status: AC
  Administered 2012-03-14: 2000 [IU] via INTRAVENOUS
  Filled 2012-03-14: qty 2000

## 2012-03-14 MED ORDER — ENOXAPARIN SODIUM 80 MG/0.8ML ~~LOC~~ SOLN
80.0000 mg | Freq: Once | SUBCUTANEOUS | Status: DC
  Filled 2012-03-14: qty 0.8

## 2012-03-14 MED ORDER — HEPARIN (PORCINE) IN NACL 100-0.45 UNIT/ML-% IJ SOLN
1650.0000 [IU]/h | INTRAMUSCULAR | Status: DC
  Administered 2012-03-14: 1450 [IU]/h via INTRAVENOUS
  Administered 2012-03-15 – 2012-03-16 (×2): 1650 [IU]/h via INTRAVENOUS
  Filled 2012-03-14 (×6): qty 250

## 2012-03-14 MED ORDER — METOLAZONE 10 MG PO TABS
10.0000 mg | ORAL_TABLET | Freq: Once | ORAL | Status: AC
  Administered 2012-03-14: 10 mg via ORAL
  Filled 2012-03-14: qty 1

## 2012-03-14 MED ORDER — ENSURE COMPLETE PO LIQD
237.0000 mL | Freq: Two times a day (BID) | ORAL | Status: DC
  Administered 2012-03-14 – 2012-03-17 (×6): 237 mL via ORAL

## 2012-03-14 MED ORDER — ENOXAPARIN SODIUM 80 MG/0.8ML ~~LOC~~ SOLN: 80.0000 mg | Freq: Two times a day (BID) | SUBCUTANEOUS | Status: DC

## 2012-03-14 MED ORDER — POTASSIUM CHLORIDE CRYS ER 20 MEQ PO TBCR
40.0000 meq | EXTENDED_RELEASE_TABLET | Freq: Once | ORAL | Status: AC
  Administered 2012-03-14: 40 meq via ORAL
  Filled 2012-03-14: qty 2

## 2012-03-14 MED ORDER — MILRINONE IN DEXTROSE 200-5 MCG/ML-% IV SOLN
0.1250 ug/kg/min | INTRAVENOUS | Status: DC
  Administered 2012-03-14 – 2012-03-18 (×4): 0.125 ug/kg/min via INTRAVENOUS
  Filled 2012-03-14 (×5): qty 100

## 2012-03-14 NOTE — Progress Notes (Signed)
CRITICAL VALUE ALERT  Critical value received:  CO2-40  Date of notification:  03-14-12  Time of notification:  0920  Critical value read back:yes  Nurse who received alert:  Carlos American   MD notified (1st page):  Short, MD  Time of first page:  0920  MD notified (2nd page):  Time of second page:  Responding MD:  Malachi Bonds, MD  Time MD responded: [phone]

## 2012-03-14 NOTE — Clinical Social Work Psychosocial (Signed)
     Clinical Social Work Department BRIEF PSYCHOSOCIAL ASSESSMENT 03/14/2012  Patient:  Ivan [name], Ivan [name]     Account Number:  [id number]     Admit date:  03/12/2012  Clinical Social Worker:  Doree Albee  Date/Time:  03/14/2012 05:00 PM  Referred by:  RN  Date Referred:  03/14/2012 Referred for  SNF Placement   Other Referral:   Interview type:  Family Other interview type:   pt daughter, hcpoa    PSYCHOSOCIAL DATA Living Status:  FACILITY Admitted from facility:  [facility] Level of care:  Skilled Nursing Facility Primary support name:  Raymon Mutton Primary support relationship to patient:  CHILD, ADULT Degree of support available:   strong, hcpoa    CURRENT CONCERNS Current Concerns  Post-Acute Placement   Other Concerns:    SOCIAL WORK ASSESSMENT / PLAN CSW spoke with pt daughter by phone to discuss pt current living environment. Pt currently on bipap and sleeping. Pt rn encouraged csw to speak with pt daughter.    Pt daughter states that pt is a long term resident at [facility] for the past 3 years or more. Pt daughter shared that pt has mediciad and pt family could not afford bed hold.    CSW and pt daughter discussed csw role regarding [facility] and other otpions. At this time, pt duaghter reamins hopeful for pt to return to [facility] but is willing to look into other options if necessary.   Assessment/plan status:  Psychosocial Support/Ongoing Assessment of Needs Other assessment/ plan:   Information/referral to community resources:   no resources identifieid at this time    PATIENTS/FAMILYS RESPONSE TO PLAN OF CARE: Pt daughter thanked csw for concern and support. Pt daughter is hopeful for pt to return to [facility] when medically stable.

## 2012-03-14 NOTE — Progress Notes (Signed)
Spoke to [place] in the lab who verified that all ordered labs would be "drawn soon."

## 2012-03-14 NOTE — Progress Notes (Signed)
ANTICOAGULATION CONSULT NOTE - Follow Up Consult  Pharmacy Consult for Heaprin Indication: LUE DVT  Allergies  Allergen Reactions  . Sulfa Antibiotics Rash  . Atorvastatin   . Penicillins   . Simvastatin     Patient Measurements: Height: 5\' 8"  (172.7 cm) Weight: 182 lb 15.7 oz (83 kg) IBW/kg (Calculated) : 68.4   Vital Signs: Temp: 97.6 F (36.4 C) (08/19 1928) Temp src: Axillary (08/19 1928) BP: 119/86 mmHg (08/19 1945) Pulse Rate: 76  (08/19 1945)  Labs:  Basename 03/14/12 1415 03/14/12 0800 03/13/12 1700 03/13/12 1410 03/13/12 0456 03/12/12 1553 03/12/12 1552  HGB 7.6* 8.1* -- -- -- -- --  HCT 26.3* 27.6* -- -- 26.7* -- --  PLT 192 199 -- -- 197 -- --  APTT -- -- -- -- -- 32 --  LABPROT -- 18.2* -- -- -- 16.4* --  INR -- 1.48 -- -- -- 1.30 --  HEPARINUNFRC -- 0.39 -- 0.31 0.24* -- --  CREATININE 0.51 0.83 0.75 -- -- -- --  CKTOTAL -- -- -- -- -- -- --  CKMB -- -- -- -- -- -- --  TROPONINI <0.30 -- -- -- -- -- <0.30    Estimated Creatinine Clearance: 73.4 ml/min (by C-G formula based on Cr of 0.51).   Assessment: 85yom [facility] resident admitted for increased SOB.  Found to have LUE VTE. On heparin was going to transition to lovenox this afternoon but after paracentesis, which did not happen. Now to restart heparin infusion, pending paracentesis. Previously therapeutic on 1450 units/hr. INR 1.48 this morning, will give a smaller bolus  Goal of Therapy:  Heparin level = 0.6-1.2 Monitor platelets by anticoagulation protocol: Yes INR = 2-3   Plan:  - D/C lovenox orders - Heparin bolus 2000 units x 1 - Heparin infusion 1450 units/hr - f/u 8 hr heparin level at 0500 - f/u AM INR and plans after paracentesis  Bayard Hugger, PharmD, BCPS  Clinical Pharmacist  Pager: [phone]  03/14/2012,8:56 PM

## 2012-03-14 NOTE — Progress Notes (Signed)
[facility] PROGRESS NOTE  Ivan [name] AVW:[id number] DOB: March 11, 1927 DOA: 03/12/2012 PCP: Oliver Barre, MD  Assessment/Plan: Principal Problem:  *Acute exacerbation of congestive heart failure Active Problems:  DIABETES MELLITUS, TYPE II  ANXIETY  HYPERTENSION  PERIPHERAL VASCULAR DISEASE  AV block, 2nd degree  Weakness  Anemia  Pleural effusion  Respiratory failure with hypercapnia  Acute kidney injury  Infected decubitus ulcer  Protein malnutrition  The patient is an 78 year old male with CAD, severe PVD, 2nd degree heart block s/p pacemaker placement, CHF, DVT of left arm, anemia, and 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 Patient presents with respiratory failure likely due to CHF exacerbation, unknown trigger, although his lasix may have been weaned from twice daily to once daily over the last month.    CHF exacerbation with bilateral pleural effusions and hypercapneic respiratory failure. Increasing arrhythmias with decreasing urine output overnight, so weaning dobumatine.  Patient having increased work of breathing and continues to have rales and JVP to angle of mandible.  Refusing bipap except if it means "life and death."  - Strict I/Os  - Daily weights  - Restart diet - Goal of -2-3 L - Lasix 120mg  IV q6h and consider lasix gtt for closer titration of uop - BMP with Magnesium q12h during aggressive diuresis - Metolazone okay per pharmacy > dose 30 minutes prior to next dose of lasix  - Decrase dobutamine to 2 mcg/kg/min, then 82mcg/kg/min, then off - Will start milrinone at 125 mcg/kg/min - Appreciate Cardiology recommendations - Full labs, including ABG - Restart bipap.  CAD (hypokinesis of basal-midinferior myocardium in setting of severe PVD), HTN, HL  - On ASA  - Consider adding low-dose beta-blocker pending d/c of dobutamine gtt  - Holding statin and fibrate due to reported allergy to formulary meds and recent AKI   Bilateral  pleural effusions, likely secondary to CHF exacerbation and transudative, however, may be contributing the respiratory distress. Patient appears to be improving with diuresis  - Consider thoracentesis if respiratory distress does not improve.   Acute kidney injury, may be cardiorenal, but consider obstruction also. FEurea 33% (mixed) - Monitor creatinine (improved initially with diuresis) - Renally dose medications  - Avoid nephrotoxic agents   Left arm swelling with history of previous DVT, off AC.  DVT positive on Duplex today. - Will transition to lovenox pending clinically stable - Start warfarin - Monitor closely for signs of bleeding given recent GIB and biopsy   Abdominal discomfort, likely due to gut edema, urinary retention, but also consider cardiac ascites +/- SBP (less likely), or mesenteric ischemia in a vasculopath (less likely given degree and timing of symptoms) .  [place] positive for ascites -  Diagnostic paracentesis - Diuresis as above   Left leg ulcer with purulent drainage concerning for infection, however no fever or white count and no gross cellulitis  - Wound care consult  - Start broad spectrum abx if appears to worsen   Diabetes, fingerstick 108 upon arrival in ER  - DC lantus 5 units as fingersticks well controlled without previous days dose in setting of resolving AKI. - SSI low-coverage   Anemia, stable from prior. Likely not contributing to dyspnea  - Monitor for now   GERD  - Continue PPI but increase to BID since starting A/C after recent procedure.  DIET:  Diabetic diet  ACCESS:  PICC PROPH: Heparin gtt + coumadin + increase PPI to BID   Code Status: Full code Family Communication: Spoke with  family at arrival to ER Disposition Plan:  Pending stable respiratory status after adequate diuresis, plan for anticoagulation outpatient.   Brief narrative:  The patient is an 78 year old male with history of CAD s/p MI without CABG or stent. He had  worsening CHF with 2nd degree 2:1 heart block requiring pacemaker placement 12/03/2011. He also has diabetes, PVD s/p right AKA, left calf ulcer.  The patient was at his baseline with 2L home oxygen about 5 days ago. He has been becoming progressively more [name] of breath. Last night, he was lifted via [facility] and when he was placed back in bed, he felt very [name] of breath and may have had an episode of syncope. He turned cyanotic and they turned up his oxygen. He had a CXR in the morning ordered by the nurse practitioner at the [facility] nursing home where he stays and it showed pulmonary edema and pleural effusions. He was transported to the [facility] for further evalaution. He has had a nonproductive cough, worsening swelling of the left arm, and a new fluid blister of the left leg which burst recently. He feels like drowning when he lies back. Dyspnea without exertion.  Per history, he has had a possible DVT in the left arm previously which was treated with coumadin. He has not been on coumadin for several weeks due to his GI bleed and because he recently underwent EGD with biopsy on 02/27/12.    Consultants:  Cardiology  Critical care  Procedures:  Abd [place] 8/18  PICC line 8/18  Antibiotics:  None  HPI/Subjective:  Patient feels better and able to communicate while using nasal canula.  States he feels thirsty and hungry, but denies nausea, vomiting, diarrhea.  No BM overnight.  Breathing he states is improved.  Denies chest pain and palpitations.     Objective: Filed Vitals:   03/14/12 0400 03/14/12 0500 03/14/12 0731 03/14/12 1223  BP: 125/90 119/71 125/74 131/63  Pulse: 84 82 81 82  Temp: 98.2 F (36.8 C)  97.6 F (36.4 C) 97.6 F (36.4 C)  TempSrc: Oral  Oral Oral  Resp: 20  22 21   Height:      Weight: 83 kg (182 lb 15.7 oz)     SpO2: 97%  97% 99%    Intake/Output Summary (Last 24 hours) at 03/14/12 1415 Last data filed at 03/14/12 1200  Gross per 24 hour    Intake   1466 ml  Output   1700 ml  Net   -234 ml   Filed Weights   03/12/12 2251 03/13/12 0520 03/14/12 0400  Weight: 88.9 kg (195 lb 15.8 oz) 85.8 kg (189 lb 2.5 oz) 83 kg (182 lb 15.7 oz)    Exam:   General:  Obese caucasian male, mild respiratory distress with tachypnea and with SCM rtx  HEENT:  Nares clear, mouth tachy  Neck:  JVP to the tragus while seated upright  Cardiovascular:  RRR, 1/6 systolic murmur at the LSB, S1, ps S2  Respiratory: Rales to apices  Abdomen: NABS, distended with mild fluid wave, tender to palpation along midline and suprapubic region without rebound or guarding  EXT:  Slow 3+ pitting edema all extremities, particularly buttucks, legs, and dependent side of arms  Psych:  Not able to give review of systems reliably this morning, sleepy during exam  Data Reviewed: Basic Metabolic Panel:  Lab 03/14/12 6213 03/13/12 1700 03/13/12 0830 03/13/12 0456 03/12/12 1553  NA 140 139 -- 139 137  K 3.3*  3.6 -- 3.2* 3.9  CL 93* 93* -- 93* 92*  CO2 40* 42* -- 38* 39*  GLUCOSE 119* 101* -- 92 107*  BUN 21 21 -- 22 24*  CREATININE 0.83 0.75 -- 0.77 0.87  CALCIUM 9.3 9.3 -- 9.2 9.8  MG 1.9 1.9 1.5 -- --  PHOS -- -- -- -- --   Liver Function Tests:  Lab 03/13/12 0456 03/12/12 1553  AST 30 34  ALT 9 11  ALKPHOS 41 47  BILITOT 0.9 0.9  PROT 6.4 7.3  ALBUMIN 2.5* 2.9*   No results found for this basename: LIPASE:5,AMYLASE:5 in the last 168 hours No results found for this basename: AMMONIA:5 in the last 168 hours CBC:  Lab 03/14/12 0800 03/13/12 0456 03/12/12 1553  WBC 6.4 7.1 8.0  NEUTROABS -- -- --  HGB 8.1* 8.1* 8.9*  HCT 27.6* 26.7* 30.3*  MCV 77.5* 75.4* 76.7*  PLT 199 197 243   Cardiac Enzymes:  Lab 03/12/12 1552  CKTOTAL --  CKMB --  CKMBINDEX --  TROPONINI <0.30   BNP (last 3 results)  Basename 03/12/12 1552 12/04/11 1053  PROBNP 23086.0* 6095.0*   CBG:  Lab 03/14/12 1220 03/14/12 0803 03/13/12 2114 03/13/12 1717 03/13/12  1203  GLUCAP 125* 113* 128* 115* 90    Recent Results (from the past 240 hour(s))  MRSA PCR SCREENING     Status: Normal   Collection Time   03/12/12 11:12 PM      Component Value Range Status Comment   MRSA by PCR NEGATIVE  NEGATIVE Final      Studies: US Abdomen Limited  03/13/2012  *RADIOLOGY REPORT*  Clinical Data: Evaluate for ascites.  LIMITED ABDOMEN ULTRASOUND FOR ASCITES  Technique:  Limited ultrasound survey for ascites was performed in all four abdominal quadrants.  Comparison:  None.  Findings: Small to moderate volume abdominal and pelvic ascites. Incidental note is made of bilateral pleural effusions.  Liver not well evaluated.  IMPRESSION:  1.  Small to moderate volume abdominal pelvic ascites. 2.  Bilateral pleural effusions.  Original Report Authenticated By: Consuello Bossier, M.D.   Dg Chest Port 1 View  03/13/2012  *RADIOLOGY REPORT*  Clinical Data: Line placement.  PORTABLE CHEST - 1 VIEW  Comparison: 1 day prior  Findings: A right-sided PICC line followed to the level of the high right atrium. Pacer with leads at right atrium and right ventricle. No lead discontinuity.  No pneumothorax.  Moderate cardiomegaly.  Layering bilateral pleural effusions are not significantly changed.  Low lung volumes with slight improvement in mild interstitial edema.  Bibasilar airspace disease is similar.  IMPRESSION:  1.  Right-sided PICC line terminating over the high right atrium. If mid to low SVC position is desired, recommend retraction 5.8 cm. 2.  Slight improvement in mild congestive heart failure. 3.  Similar bilateral pleural effusions and bibasilar airspace disease, likely atelectasis.  Original Report Authenticated By: Consuello Bossier, M.D.   Dg Chest Portable 1 View  03/12/2012  *RADIOLOGY REPORT*  Clinical Data: 78 year old male with shortness of breath, swelling.  PORTABLE CHEST - 1 VIEW  Comparison: 12/05/2011 and earlier.  Findings: Portable semi upright AP view at 1617 hours.  More  lordotic view. Stable cardiomegaly and mediastinal contours. Stable left chest cardiac pacemaker.  Increase veiling opacity on the right.  No pneumothorax.  Stable vascular congestion, no overt pulmonary edema.  Continued bibasilar opacity.  IMPRESSION: Interval increased right pleural effusion, probably moderate. Stable vascular congestion and basilar atelectasis.  Original Report Authenticated By: Ulla Potash III, M.D.    Scheduled Meds:    . acetaZOLAMIDE  500 mg Intravenous Once  . antiseptic oral rinse  15 mL Mouth Rinse BID  . aspirin EC  81 mg Oral Daily  . docusate sodium  100 mg Oral BID  . dutasteride  0.5 mg Oral Daily  . enoxaparin (LOVENOX) injection  80 mg Subcutaneous Once  . enoxaparin (LOVENOX) injection  80 mg Subcutaneous Once  . enoxaparin (LOVENOX) injection  80 mg Subcutaneous Q12H  . feeding supplement  237 mL Oral BID BM  . FLUoxetine  30 mg Oral Daily  . fluticasone  2 spray Each Nare Daily  . furosemide  120 mg Intravenous Q6H  . insulin aspart  0-5 Units Subcutaneous QHS  . insulin aspart  0-9 Units Subcutaneous TID WC  . iron polysaccharides  150 mg Oral Daily  . lidocaine  1 patch Transdermal Daily  . loratadine  5 mg Oral Daily  . multivitamin with minerals  1 tablet Oral Daily  . nitroGLYCERIN  1 inch Topical Q6H  . olopatadine  1 drop Both Eyes BID  . pantoprazole  40 mg Oral BID AC  . patient's guide to using coumadin book   Does not apply Once  . polyethylene glycol  17 g Oral Daily  . potassium chloride  40 mEq Oral Once  . senna-docusate  1 tablet Oral QHS  . sodium chloride  10-40 mL Intracatheter Q12H  . spironolactone  25 mg Oral BID  . warfarin  5 mg Oral ONCE-1800  . warfarin  5 mg Oral ONCE-1800  . warfarin   Does not apply Once  . Warfarin - Pharmacist Dosing Inpatient   Does not apply q1800  . zolpidem  5 mg Oral QHS  . DISCONTD: antiseptic oral rinse  15 mL Mouth Rinse BID  . DISCONTD: antiseptic oral rinse  15 mL Mouth Rinse q12n4p    . DISCONTD: chlorhexidine  15 mL Mouth/Throat BID  . DISCONTD: furosemide  120 mg Intravenous Q8H  . DISCONTD: insulin glargine  5 Units Subcutaneous QHS  . DISCONTD: pantoprazole  40 mg Oral Q1200  . DISCONTD: sodium chloride  3 mL Intravenous Q12H   Continuous Infusions:    . sodium chloride 20 mL/hr (03/14/12 0522)  . sodium chloride 20 mL/hr (03/14/12 0527)  . DOBUTamine 2 mcg/kg/min (03/14/12 1352)  . DISCONTD: heparin 1,450 Units/hr (03/14/12 0523)    Principal Problem:  *Acute exacerbation of congestive heart failure Active Problems:  DIABETES MELLITUS, TYPE II  ANXIETY  HYPERTENSION  PERIPHERAL VASCULAR DISEASE  AV block, 2nd degree  Weakness  Anemia  Pleural effusion  Respiratory failure with hypercapnia  Acute kidney injury  Infected decubitus ulcer  Protein malnutrition    Time spent: 45    Jennah Satchell, [facility]  [facility] Pager [phone]. If 8PM-8AM, please contact night-coverage at [email or web address], password [facility] 03/14/2012, 2:15 PM  LOS: 2 days

## 2012-03-14 NOTE — Progress Notes (Signed)
Treatment cancelled today due to patient was total lift at [facility], spoke with Glena Norfolk from [facility]. Patient was total assist for ADL's as well. Will not follow as patient is not appropriate for skilled PT/OT. Thanks.Lise Auer. OT

## 2012-03-14 NOTE — Progress Notes (Signed)
CRITICAL VALUE ALERT  Critical value received:  PCO2 88.9  Date of notification:  03-14-12  Time of notification:  1500  Critical value read back:yes  Nurse who received alert:  Samson Frederic, RN  MD notified (1st page):  Short, MD  Time of first page:  1500  MD notified (2nd page):  Time of second page:  Responding MD:  Malachi Bonds, MD  Time MD responded:  [phone]

## 2012-03-14 NOTE — Progress Notes (Signed)
CRITICAL VALUE ALERT  Critical value received:  PCO2-86.2  Date of notification:  03-14-12  Time of notification:  1730  Critical value read back:yes  Nurse who received alert:  Armida Sans  MD notified (1st page):  Short, MD  Time of first page:  1730  MD notified (2nd page):  Time of second page:  Responding MD:  Malachi Bonds, MD  Time MD responded:  [phone]

## 2012-03-14 NOTE — Consult Note (Signed)
Name: Ivan [name] MRN: [id number] DOB: 1927/05/02    LOS: 2  Consulting MD: Dr. Malachi Bonds / [facility] Reason for Consult:  AMS   [place] [facility] Note   History of Present Illness:  78 y/o M, SNF Resident, with PMH of DM, PVD s/p R AKA, CAD s/p MI, CHF s/p ICD 12/03/11, Anemia admitted on  8/17 from SNF with 5 day history of progressive shortness of breath.  On 2L O2 at baseline and was being put back to bed with a Hoyer lift when he had a sudden increase in SOB and questionable syncopal episode -turned cyanotic and they increased his O2.  He has been unable to lie flat.  CXR at [facility] demonstrated pulmonary edema and significant effusions.  Diuretics had been adjusted by Dr. Katrinka Blazing (decreased to lasix 80mg  / 25mg  spironolactone).  Also noted increased non-productive cough, worsened swelling of L arm and fluid blisters on LE's.  Was previously on coumadin for DVT in L arm but was stopped for recent GI Bleed in setting of gastric carcinoid.  8/19 noted to have altered mental status -PCCM consulted for consideration of thoracentesis / BIPAP.  Of note, patient received ambien 10 mg on evening prior to AMS.   Lines / Drains: PICC RUE 8/18>>>  Cultures: 8/17 MRSA PCR>>>neg  Antibiotics:   Tests / Events: 5/13 ECHO>>>EF 45-50% 8/18 ECHO>>>Left ventricle: Systolic function was severely reduced. The estimated ejection fraction was in the range of 25% to30%.  Ventricular septum: Septal motion showed abnormal function and dyssynergy.  Mitral valve: Calcified annulus. Mildly thickened leaflets.  Left atrium: The atrium was mildly dilated.  Right ventricle: The cavity size was mildly dilated. Systolic function was mildly reduced. Right atrium: The atrium was mildly dilated.  Pulmonary arteries: Systolic pressure was mildly to moderately increased.    Past Medical History  Diagnosis Date  . Arthritis   . Diabetes mellitus   . Anemia   . PVD (peripheral vascular disease)   . ICD  (implantable cardiac defibrillator) in place   . Shortness of breath   . Coronary artery disease   . Myocardial infarction   . Anxiety   . Pacemaker   . CHF (congestive heart failure)     Past Surgical History  Procedure Date  . Shoulder surgery   . Leg amputation through knee 2006  . Hernia repair   . Total hip arthroplasty     right side  . Esophagogastroduodenoscopy 01/08/2012    Procedure: ESOPHAGOGASTRODUODENOSCOPY (EGD);  Surgeon: Theda Belfast, MD;  Location: [facility];  Service: Endoscopy;  Laterality: N/A;  . Esophagogastroduodenoscopy 02/26/2012    Procedure: ESOPHAGOGASTRODUODENOSCOPY (EGD);  Surgeon: Theda Belfast, MD;  Location: [facility];  Service: Endoscopy;  Laterality: N/A;  pt is wheelchair bound  . Balloon dilation 02/26/2012    Procedure: BALLOON DILATION;  Surgeon: Theda Belfast, MD;  Location: [facility];  Service: Endoscopy;  Laterality: N/A;  . Insert / replace / remove pacemaker     Prior to Admission medications   Medication Sig Start Date End Date Taking? Authorizing Provider  acetic acid-aluminum acetate (DOMEBORO OTIC) 2 % otic solution Place 2 drops into both ears 2 (two) times daily as needed. For infective otitis externa   Yes Historical Provider, MD  Artificial Tear Ointment (LACRI-LUBE S.O.P.) OINT Apply 0.25-0.5 strips to eye at bedtime as needed. For eye irritation   Yes Historical Provider, MD  aspirin EC 81 MG tablet Take 81 mg by mouth daily.  Yes Historical Provider, MD  Carboxymethylcellul-Glycerin (OPTIVE) 0.5-0.9 % SOLN Apply 1-2 drops to eye 2 (two) times daily as needed. For dry eyes   Yes Historical Provider, MD  Choline Fenofibrate (TRILIPIX) 135 MG capsule Take 135 mg by mouth daily.   Yes Historical Provider, MD  docusate sodium (COLACE) 100 MG capsule Take 100 mg by mouth 2 (two) times daily.   Yes Historical Provider, MD  dutasteride (AVODART) 0.5 MG capsule Take 0.5 mg by mouth daily.   Yes Historical Provider, MD    FLUoxetine (PROZAC) 10 MG capsule Take 30 mg by mouth daily.    Yes Historical Provider, MD  fluticasone (FLONASE) 50 MCG/ACT nasal spray Place 2 sprays into the nose daily.   Yes Historical Provider, MD  furosemide (LASIX) 80 MG tablet Take 80 mg by mouth daily.   Yes Historical Provider, MD  insulin glargine (LANTUS) 100 UNIT/ML injection Inject 5 Units into the skin at bedtime.    Yes Historical Provider, MD  iron polysaccharides (NIFEREX) 150 MG capsule Take 150 mg by mouth daily.   Yes Historical Provider, MD  lidocaine (LIDODERM) 5 % Place 1 patch onto the skin daily. Remove & Discard patch within 12 hours or as directed by MD.  Apply to painful area.   Yes Historical Provider, MD  loratadine (CLARITIN) 5 MG chewable tablet Chew 5 mg by mouth daily.   Yes Historical Provider, MD  LORazepam (ATIVAN) 0.5 MG tablet Take 0.25 mg by mouth 2 (two) times daily as needed. For anxiety   Yes Historical Provider, MD  metFORMIN (GLUCOPHAGE) 1000 MG tablet Take 1,000 mg by mouth 2 (two) times daily with a meal.   Yes Historical Provider, MD  olopatadine (PATANOL) 0.1 % ophthalmic solution Place 1 drop into both eyes 2 (two) times daily as needed. For dry eyes   Yes Historical Provider, MD  omeprazole (PRILOSEC) 40 MG capsule Take 40 mg by mouth 2 (two) times daily.   Yes Historical Provider, MD  polyethylene glycol (MIRALAX / GLYCOLAX) packet Take 17 g by mouth daily.   Yes Historical Provider, MD  potassium chloride SA (K-DUR,KLOR-CON) 20 MEQ tablet Take 20 mEq by mouth daily.   Yes Historical Provider, MD  rosuvastatin (CRESTOR) 20 MG tablet Take 20 mg by mouth every evening.   Yes Historical Provider, MD  senna-docusate (SENOKOT-S) 8.6-50 MG per tablet Take 1 tablet by mouth at bedtime.   Yes Historical Provider, MD  spironolactone (ALDACTONE) 25 MG tablet Take 25 mg by mouth 2 (two) times daily.   Yes Historical Provider, MD  Vitamin D, Ergocalciferol, (DRISDOL) 50000 UNITS CAPS Take 50,000 Units by  mouth every 30 (thirty) days.   Yes Historical Provider, MD  zolpidem (AMBIEN) 5 MG tablet Take 5 mg by mouth at bedtime as needed. For sleep   Yes Historical Provider, MD    Allergies Allergies  Allergen Reactions  . Sulfa Antibiotics Rash  . Atorvastatin   . Penicillins   . Simvastatin     Family History Family History  Problem Relation Age of Onset  . Coronary artery disease Father   . Stroke Father   . Coronary artery disease Brother   . Cancer Mother   . Diabetes Father   . Diabetes Brother     Social History  reports that he quit smoking about 53 years ago. His smoking use included Cigarettes. He smoked 1.5 packs per day. He does not have any smokeless tobacco history on file. He reports that he does  not drink alcohol or use illicit drugs.  Review Of Systems:  Unable to complete due to patient fatigue / lethargy but oriented to short questions.    Vital Signs: Temp:  [97.5 F (36.4 C)-98.2 F (36.8 C)] 97.6 F (36.4 C) (08/19 1223) Pulse Rate:  [75-86] 82  (08/19 1223) Resp:  [20-27] 21  (08/19 1223) BP: (112-149)/(46-113) 131/63 mmHg (08/19 1223) SpO2:  [95 %-99 %] 99 % (08/19 1223) Weight:  [182 lb 15.7 oz (83 kg)] 182 lb 15.7 oz (83 kg) (08/19 0400) I/O last 3 completed shifts: In: 1900.3 [P.O.:120; I.V.:1594.3; IV Piggyback:186] Out: 3150 [Urine:3150]  Physical Examination: General: elderly, chronically ill male in NAD Neuro: awakens, answers short answers appropriately, MAD but generalized weakness CV: s1s2 rrr, distant PULM: resp's even/non-labored, lungs bilaterally with crackles GI: abd large, soft, bsx4 active Extremities: warm/dry, lower edema 2+, kerlex dressing to LE  Labs    CBC  Lab 03/14/12 0800 03/13/12 0456 03/12/12 1553  HGB 8.1* 8.1* 8.9*  HCT 27.6* 26.7* 30.3*  WBC 6.4 7.1 8.0  PLT 199 197 243     BMET  Lab 03/14/12 0800 03/13/12 1700 03/13/12 0830 03/13/12 0456 03/12/12 1553  NA 140 139 -- 139 137  K 3.3* 3.6 -- -- --  CL  93* 93* -- 93* 92*  CO2 40* 42* -- 38* 39*  GLUCOSE 119* 101* -- 92 107*  BUN 21 21 -- 22 24*  CREATININE 0.83 0.75 -- 0.77 0.87  CALCIUM 9.3 9.3 -- 9.2 9.8  MG 1.9 1.9 1.5 -- --  PHOS -- -- -- -- --     Lab 03/14/12 0800 03/12/12 1553  INR 1.48 1.30     Lab 03/13/12 1830 03/12/12 2104 03/12/12 1949  PHART 7.390 7.412 7.350  PCO2ART 65.7* 63.9* 74.9*  PO2ART 85.0 61.0* 98.0  HCO3 38.9* 40.7* 41.3*  TCO2 40.9 43 44  O2SAT 97.6 90.0 97.0     Radiology: 8/18 PCXR>>>Diminished aeration with worsening of probable CHF with effusions.   Assessment and Plan: Principal Problem:  *Acute exacerbation of congestive heart failure Active Problems:  DIABETES MELLITUS, TYPE II  ANXIETY  HYPERTENSION  PERIPHERAL VASCULAR DISEASE  AV block, 2nd degree  Weakness  Anemia  Pleural effusion  Respiratory failure with hypercapnia  Acute kidney injury  Infected decubitus ulcer  Protein malnutrition   AMS / Lethargy Assessment: AMS likely multifactorial in setting of CHF exacerbation, Ambien administration and resultant hypercarbia.    Plan: -discontinue ambien -ABG reviewed, bipap now, hopefully will not require for prolonged period   Dyspnea  Assessment:  Acute decompensated CHF - noted decrease in EF from 5/13, and profound anemia.  Associated bilateral effusions.    Plan: -no indication for thoracentesis at this time -continue aggressive diuresis  -O2 to support sats >90% -ABG reviewed and bipap initiated  DVT Assessment: LUE DVT Plan: -coumadin per primary, caution with hx of GI Bleeding, advanced age  CHF Combined diastolic and systolic dysfxn with decompensation - agree with change from dobuta to milrinone, diuretics per cardiology recs   Further Medical Issues per IM.   Consider Goals of Care given multiple co-morbidities and advanced age.     Canary Brim, NP-C [facility] [place] [facility] Pgr: [phone]  03/14/2012, 2:26 PM    Attending  Addendum:  I have seen the patient, discussed the issues, test results and plans with B. Veleta Miners, NP. I agree with the Assessment and Plans as ammended above.   Levy Pupa, MD, PhD 03/14/2012, 3:29  PM [facility] [phone] or if no answer [phone]

## 2012-03-14 NOTE — Progress Notes (Signed)
Pt placed on BIPAP 14/6 40% per MD order,  Pt tolerating well at this time.  RT will continue to monitor.

## 2012-03-14 NOTE — Progress Notes (Signed)
Patient Name: BOBBYJOE PABST Date of Encounter: 03/14/2012    SUBJECTIVE:Worsening shortness of breath and edema despite aggressive diuresis as outpatient. We recently decreased lasix to 80 mg daily as OP along with 25 mg BID spironolactone. Also recently noted to have Hgb 8.7 by Dr. Elnoria Howard.  TELEMETRY:  Pacing: Filed Vitals:   03/14/12 0400 03/14/12 0500 03/14/12 0731 03/14/12 1223  BP: 125/90 119/71 125/74 131/63  Pulse: 84 82 81 82  Temp: 98.2 F (36.8 C)  97.6 F (36.4 C) 97.6 F (36.4 C)  TempSrc: Oral  Oral Oral  Resp: 20  22 21   Height:      Weight: 83 kg (182 lb 15.7 oz)     SpO2: 97%  97% 99%    Intake/Output Summary (Last 24 hours) at 03/14/12 1227 Last data filed at 03/14/12 1200  Gross per 24 hour  Intake 1551.6 ml  Output   2100 ml  Net -548.4 ml    LABS: Basic Metabolic Panel:  Basename 03/14/12 0800 03/13/12 1700  NA 140 139  K 3.3* 3.6  CL 93* 93*  CO2 40* 42*  GLUCOSE 119* 101*  BUN 21 21  CREATININE 0.83 0.75  CALCIUM 9.3 9.3  MG 1.9 1.9  PHOS -- --   CBC:  Basename 03/14/12 0800 03/13/12 0456  WBC 6.4 7.1  NEUTROABS -- --  HGB 8.1* 8.1*  HCT 27.6* 26.7*  MCV 77.5* 75.4*  PLT 199 197   Cardiac Enzymes:  Basename 03/12/12 1552  CKTOTAL --  CKMB --  CKMBINDEX --  TROPONINI <0.30  Radiology/Studies:  CXR with right effusion and CHF  Physical Exam: Blood pressure 131/63, pulse 82, temperature 97.6 F (36.4 C), temperature source Oral, resp. rate 21, height 5\' 8"  (1.727 m), weight 83 kg (182 lb 15.7 oz), SpO2 99.00%. Weight change: -5.9 kg (-13 lb 0.1 oz)   RLE stump edema. Abd is mildly distended and could have ascites. Awake and alert.  ASSESSMENT:  1. Acute on chronic diastolic heart failure with reaccumulation of edema despite diuretic therapy.  2. Severe anemia aggravates the management of CHF  3. Possible CO2 retension  Plan:  1. Agree with aggressive in hospital diuresis. 2. Should consider transfusion to Hgb > 9.5  prior to d/c after chf had been adequately treated. 3. Probably has underlying component of ischemic heart disease that would be difficult to treat given his comorbidities.  [name] 03/14/2012, 12:27 PM

## 2012-03-14 NOTE — Progress Notes (Signed)
Patient has been placed back on BiPAP secondary to increased pCO2 and somnolence for >1h without signficant change in pH or pCO2.  Spoke with family about options of ICU for possible intubation and more aggressive care versus staying in stepdown and continuing current measures.  Per family, patient has previously expressed wish for DNR status and would wish to be comfortable.    Agreed to BiPAP overnight and will stop in morning.  Will not attempt to reapply if patient becomes more Ivan [name] of breath or hypercapnic.  Will try milrinone gtt overnight and continue blood transfusion.  If patient becomes progressively more hypercapneic, will DC BiPAP and other measures and initiate comfort measures of morphine and nasal cannula.    Patient is DNR/DNI.  Order placed.

## 2012-03-14 NOTE — Progress Notes (Signed)
MD, Short wants [place] to hold off on paracentesis for now. Pt ABG back, started on bipap. Can now start blood transfusion after bipap placed. Nadezhda Pollitt L

## 2012-03-14 NOTE — Progress Notes (Addendum)
ANTICOAGULATION CONSULT NOTE - Follow Up Consult  Pharmacy Consult for Heaprin/coumadin Indication: LUE DVT  Allergies  Allergen Reactions  . Sulfa Antibiotics Rash  . Atorvastatin   . Penicillins   . Simvastatin     Patient Measurements: Height: 5\' 8"  (172.7 cm) Weight: 182 lb 15.7 oz (83 kg) IBW/kg (Calculated) : 68.4   Vital Signs: Temp: 97.6 F (36.4 C) (08/19 0731) Temp src: Oral (08/19 0731) BP: 125/74 mmHg (08/19 0731) Pulse Rate: 81  (08/19 0731)  Labs:  Basename 03/14/12 0800 03/13/12 1700 03/13/12 1410 03/13/12 0456 03/12/12 1553 03/12/12 1552  HGB 8.1* -- -- 8.1* -- --  HCT 27.6* -- -- 26.7* 30.3* --  PLT 199 -- -- 197 243 --  APTT -- -- -- -- 32 --  LABPROT 18.2* -- -- -- 16.4* --  INR 1.48 -- -- -- 1.30 --  HEPARINUNFRC 0.39 -- 0.31 0.24* -- --  CREATININE 0.83 0.75 -- 0.77 -- --  CKTOTAL -- -- -- -- -- --  CKMB -- -- -- -- -- --  TROPONINI -- -- -- -- -- <0.30    Estimated Creatinine Clearance: 70.8 ml/min (by C-G formula based on Cr of 0.83).   Assessment: 85yom NH resident admitted for increased SOB.  Found to have LUE VTE.  Heparin is now therapeutic at 0.39. INR up to 1.48 today. H/H remains low but stable. Plt 199k. Pt with recent h/o of GIB so will monitor closely. D2/5 overlap. Ok to change to lovenox now.   Goal of Therapy:  Heparin level = 0.6-1.2 Monitor platelets by anticoagulation protocol: Yes INR = 2-3   Plan:  Lovenox 80mg  sq q12 Coumadin 5mg  PO x1 Daily CBC, HL  Arlester Marker, Isiah Scheel [place] 03/14/2012,9:26 AM

## 2012-03-14 NOTE — Progress Notes (Signed)
Family called and updated about pt. Condition. Ivan [name]

## 2012-03-14 NOTE — Progress Notes (Signed)
Short, MD said okay to use right hand/ lower arm for lab draws/ BP. Bilateral upper extremities restricted. Left lower extremity wound, Right AKA.  Lenus Trauger L

## 2012-03-14 NOTE — Progress Notes (Signed)
INITIAL ADULT NUTRITION ASSESSMENT Date: 03/14/2012   Time: 7:59 AM  Reason for Assessment: MD Consult for Nutrition Assessment  ASSESSMENT: Male 78 y.o.  Dx: Acute exacerbation of congestive heart failure  Hx:  Past Medical History  Diagnosis Date  . Arthritis   . Diabetes mellitus   . Anemia   . PVD (peripheral vascular disease)   . ICD (implantable cardiac defibrillator) in place   . Shortness of breath   . Coronary artery disease   . Myocardial infarction   . Anxiety   . Pacemaker   . CHF (congestive heart failure)    Past Surgical History  Procedure Date  . Shoulder surgery   . Leg amputation through knee 2006  . Hernia repair   . Total hip arthroplasty     right side  . Esophagogastroduodenoscopy 01/08/2012    Procedure: ESOPHAGOGASTRODUODENOSCOPY (EGD);  Surgeon: Theda Belfast, MD;  Location: [facility];  Service: Endoscopy;  Laterality: N/A;  . Esophagogastroduodenoscopy 02/26/2012    Procedure: ESOPHAGOGASTRODUODENOSCOPY (EGD);  Surgeon: Theda Belfast, MD;  Location: [facility];  Service: Endoscopy;  Laterality: N/A;  pt is wheelchair bound  . Balloon dilation 02/26/2012    Procedure: BALLOON DILATION;  Surgeon: Theda Belfast, MD;  Location: [facility];  Service: Endoscopy;  Laterality: N/A;  . Insert / replace / remove pacemaker    Related Meds:     . acetaZOLAMIDE  500 mg Intravenous Once  . antiseptic oral rinse  15 mL Mouth Rinse BID  . aspirin EC  81 mg Oral Daily  . docusate sodium  100 mg Oral BID  . dutasteride  0.5 mg Oral Daily  . FLUoxetine  30 mg Oral Daily  . fluticasone  2 spray Each Nare Daily  . furosemide  120 mg Intravenous Q6H  . insulin aspart  0-5 Units Subcutaneous QHS  . insulin aspart  0-9 Units Subcutaneous TID WC  . iron polysaccharides  150 mg Oral Daily  . lidocaine  1 patch Transdermal Daily  . loratadine  5 mg Oral Daily  . magnesium sulfate 1 - 4 g bolus IVPB  2 g Intravenous Once  . multivitamin with minerals  1  tablet Oral Daily  . nitroGLYCERIN  1 inch Topical Q6H  . olopatadine  1 drop Both Eyes BID  . pantoprazole  40 mg Oral BID AC  . patient's guide to using coumadin book   Does not apply Once  . polyethylene glycol  17 g Oral Daily  . potassium chloride  40 mEq Oral Once  . senna-docusate  1 tablet Oral QHS  . sodium chloride  10-40 mL Intracatheter Q12H  . spironolactone  25 mg Oral BID  . warfarin  5 mg Oral ONCE-1800  . warfarin   Does not apply Once  . Warfarin - Pharmacist Dosing Inpatient   Does not apply q1800  . zolpidem  5 mg Oral QHS  . DISCONTD: antiseptic oral rinse  15 mL Mouth Rinse q12n4p  . DISCONTD: antiseptic oral rinse  15 mL Mouth Rinse BID  . DISCONTD: antiseptic oral rinse  15 mL Mouth Rinse q12n4p  . DISCONTD: chlorhexidine  15 mL Mouth/Throat BID  . DISCONTD: furosemide  120 mg Intravenous Q8H  . DISCONTD: insulin glargine  5 Units Subcutaneous QHS  . DISCONTD: pantoprazole  40 mg Oral Q1200  . DISCONTD: potassium chloride SA  20 mEq Oral Daily  . DISCONTD: sodium chloride  3 mL Intravenous Q12H   Ht: 5\' 8"  (172.7  cm)  Wt: 182 lb 15.7 oz (83 kg)  Ideal Wt: 154 lb (70 kg) Adjusted Ideal Wt for amputation: 74.5 kg % Ideal Wt: 111%  Wt Readings from Last 15 Encounters:  03/14/12 182 lb 15.7 oz (83 kg)  01/08/12 196 lb (88.905 kg)  01/08/12 196 lb (88.905 kg)  12/04/11 196 lb 13.9 oz (89.3 kg)  12/04/11 196 lb 13.9 oz (89.3 kg)  11/02/07 176 lb (79.833 kg)  09/13/07 177 lb (80.287 kg)  06/14/07 177 lb (80.287 kg)  Usual Wt: 196 lb % Usual Wt: 93%  BMI is 29.6 - using adjusted weight for amputation; pt is overweight  Food/Nutrition Related Hx: from nursing home  Labs:  [facility]     Component Value Date/Time   NA 139 03/13/2012 1700   K 3.6 03/13/2012 1700   CL 93* 03/13/2012 1700   CO2 42* 03/13/2012 1700   GLUCOSE 101* 03/13/2012 1700   BUN 21 03/13/2012 1700   CREATININE 0.75 03/13/2012 1700   CALCIUM 9.3 03/13/2012 1700   PROT 6.4 03/13/2012 0456    ALBUMIN 2.5* 03/13/2012 0456   AST 30 03/13/2012 0456   ALT 9 03/13/2012 0456   ALKPHOS 41 03/13/2012 0456   BILITOT 0.9 03/13/2012 0456   GFRNONAA 83* 03/13/2012 1700   GFRAA >90 03/13/2012 1700   CBG (last 3)   Basename 03/13/12 2114 03/13/12 1717 03/13/12 1203  GLUCAP 128* 115* 90     Intake/Output Summary (Last 24 hours) at 03/14/12 0803 Last data filed at 03/14/12 0600  Gross per 24 hour  Intake 1379.1 ml  Output   1700 ml  Net -320.9 ml   Diet Order: Dysphagia 3 with thin liquids and 2 gram sodium restriction  Supplements/Tube Feeding: none  IVF:    sodium chloride Last Rate: 20 mL/hr (03/14/12 0522)  sodium chloride Last Rate: 20 mL/hr (03/14/12 0527)  DOBUTamine Last Rate: 3.5 mcg/kg/min (03/13/12 1824)  heparin Last Rate: 1,450 Units/hr (03/14/12 0523)   Estimated Nutritional Needs:   Kcal: 1700 - 1900 kcal Protein: 85 - 95 grams Fluid: 1.8 - 2 liters daily  Recent pacemaker placement in May. Also has DM, PVD s/p right AKA, and left calf ulcer. He has recently been using once daily lasix and twice daily spironolactone, which is administered by the NH.  From nursing home. Wheelchair bound at baseline. Able to feed self. Stage II on R buttocks, Stage II on sacrum, diabetic ulcer on lower leg.  Work-up revealed positive upper extremity DVT.  Pt unable to state his usual weight. Per Epic weight history, pt's usually 196 lb at end of June. Pt with 7% wt loss x 2 months. Noted however, pt on diuretics, unable to determine if this is true wt loss or fluid shift.  Pt states that he ate "fair" yesterday. Consuming 40% of dinner yesterday.  Pt is at nutrition risk given advanced age, skin breakdown, and current intake of <50% of meals.  NUTRITION DIAGNOSIS: -Increased nutrient needs (NI-5.1).  Status: Ongoing  RELATED TO: wound healing  AS EVIDENCE BY: estimated needs.  MONITORING/EVALUATION(Goals): Goal: Pt to meet >/= 90% of their estimated nutrition  needs Monitor: weights, labs, PO intake, I/O's  EDUCATION NEEDS: -No education needs identified at this time  INTERVENTION: 1.  Ensure Complete po BID, each supplement provides 350 kcal and 13 grams of protein. 2. RD to continue to follow nutrition care plan  DOCUMENTATION CODES Per approved criteria  -Not Applicable   Jarold Motto MS, RD, LDN Pager: [phone]  After-hours pager: [phone]

## 2012-03-14 NOTE — Progress Notes (Signed)
PT/OT  Cancellation/ D/C Note  Treatment cancelled today due to patient was total lift at [facility], spoke with Glena Norfolk from [facility].  Patient was total assist for ADL's as well.  Will not follow as patient is not appropriate for skilled PT/OT.  Thanks.Barb Merino 03/14/2012, 10:24 AM  [facility] [phone] (pager)

## 2012-03-15 ENCOUNTER — Encounter: Payer: PRIVATE HEALTH INSURANCE | Admitting: Internal Medicine

## 2012-03-15 LAB — CBC
HCT: 29.7 % — ABNORMAL LOW (ref 39.0–52.0)
Hemoglobin: 8.9 g/dL — ABNORMAL LOW (ref 13.0–17.0)
MCH: 22.9 pg — ABNORMAL LOW (ref 26.0–34.0)
MCHC: 30 g/dL (ref 30.0–36.0)
MCV: 76.3 fL — ABNORMAL LOW (ref 78.0–100.0)
Platelets: 209 10*3/uL (ref 150–400)
RBC: 3.89 MIL/uL — ABNORMAL LOW (ref 4.22–5.81)
RDW: 17.1 % — ABNORMAL HIGH (ref 11.5–15.5)
WBC: 6 10*3/uL (ref 4.0–10.5)

## 2012-03-15 LAB — BASIC METABOLIC PANEL WITH GFR
BUN: 20 mg/dL (ref 6–23)
CO2: 42 meq/L (ref 19–32)
Calcium: 9.4 mg/dL (ref 8.4–10.5)
Chloride: 93 meq/L — ABNORMAL LOW (ref 96–112)
Creatinine, Ser: 0.79 mg/dL (ref 0.50–1.35)
GFR calc Af Amer: >90 mL/min
GFR calc non Af Amer: 81 mL/min — ABNORMAL LOW
Glucose, Bld: 105 mg/dL — ABNORMAL HIGH (ref 70–99)
Potassium: 3.3 meq/L — ABNORMAL LOW (ref 3.5–5.1)
Sodium: 140 meq/L (ref 135–145)

## 2012-03-15 LAB — TYPE AND SCREEN
ABO/RH(D): O NEG
Antibody Screen: NEGATIVE
Unit division: 0

## 2012-03-15 LAB — GLUCOSE, CAPILLARY
Glucose-Capillary: 122 mg/dL — ABNORMAL HIGH (ref 70–99)
Glucose-Capillary: 160 mg/dL — ABNORMAL HIGH (ref 70–99)

## 2012-03-15 LAB — BASIC METABOLIC PANEL
BUN: 18 mg/dL (ref 6–23)
Calcium: 8.4 mg/dL (ref 8.4–10.5)
Chloride: 92 mEq/L — ABNORMAL LOW (ref 96–112)
Creatinine, Ser: 0.73 mg/dL (ref 0.50–1.35)
GFR calc Af Amer: >90 mL/min (ref >90)
GFR calc non Af Amer: 83 mL/min — ABNORMAL LOW (ref >90)

## 2012-03-15 LAB — PROTIME-INR
INR: 2.51 — ABNORMAL HIGH (ref 0.00–1.49)
Prothrombin Time: 27.5 seconds — ABNORMAL HIGH (ref 11.6–15.2)

## 2012-03-15 LAB — MAGNESIUM
Magnesium: 1.9 mg/dL (ref 1.5–2.5)
Magnesium: 1.6 mg/dL (ref 1.5–2.5)

## 2012-03-15 MED ORDER — SALINE SPRAY 0.65 % NA SOLN
1.0000 | NASAL | Status: DC | PRN
  Administered 2012-03-15: 1 via NASAL
  Filled 2012-03-15 (×2): qty 44

## 2012-03-15 MED ORDER — WHITE PETROLATUM GEL
Status: AC
  Filled 2012-03-15: qty 5

## 2012-03-15 MED ORDER — POTASSIUM CHLORIDE CRYS ER 20 MEQ PO TBCR
40.0000 meq | EXTENDED_RELEASE_TABLET | Freq: Once | ORAL | Status: AC
  Administered 2012-03-15: 40 meq via ORAL
  Filled 2012-03-15: qty 1

## 2012-03-15 NOTE — Progress Notes (Signed)
Utilization Review Completed.  Ivan [name] T  03/15/2012  

## 2012-03-15 NOTE — Progress Notes (Signed)
Patient off bipap at this time and comfortable on 3L [place] sats are 97%.  RR 16.  Patient BBS are diminished.

## 2012-03-15 NOTE — Progress Notes (Addendum)
CRITICAL VALUE ALERT  Critical value received:  PCO2  Date of notification:  03/14/12  Time of notification:  1930  Critical value read back: PCO2- 84.8  Nurse who received alert: M.Foster Simpson, RN  MD notified (1st page):  Dr. Malachi Bonds  Time of first page:    MD notified (2nd page):  Time of second page:  Responding MD:    Time MD responded:

## 2012-03-15 NOTE — Progress Notes (Signed)
ANTICOAGULATION CONSULT NOTE - Follow Up Consult  Pharmacy Consult for Heaprin Indication: LUE DVT  Allergies  Allergen Reactions  . Sulfa Antibiotics Rash  . Atorvastatin   . Penicillins   . Simvastatin     Patient Measurements: Height: 5\' 8"  (172.7 cm) Weight: 178 lb 9.2 oz (81 kg) IBW/kg (Calculated) : 68.4   Vital Signs: Temp: 97.2 F (36.2 C) (08/20 0455) Temp src: Axillary (08/20 0455) BP: 112/42 mmHg (08/20 0455) Pulse Rate: 66  (08/20 0455)  Labs:  Basename 03/15/12 0355 03/14/12 2230 03/14/12 1415 03/14/12 0800 03/13/12 1700 03/13/12 1410 03/12/12 1553 03/12/12 1552  HGB 8.9* 8.8* -- -- -- -- -- --  HCT 29.7* 28.9* 26.3* -- -- -- -- --  PLT 209 195 192 -- -- -- -- --  APTT -- -- -- -- -- -- 32 --  LABPROT 27.5* -- -- 18.2* -- -- 16.4* --  INR 2.51* -- -- 1.48 -- -- 1.30 --  HEPARINUNFRC 0.19* -- -- 0.39 -- 0.31 -- --  CREATININE -- -- 0.51 0.83 0.75 -- -- --  CKTOTAL -- -- -- -- -- -- -- --  CKMB -- -- -- -- -- -- -- --  TROPONINI -- -- <0.30 -- -- -- -- <0.30    Estimated Creatinine Clearance: 67.7 ml/min (by C-G formula based on Cr of 0.51).   Assessment: 85yom NH resident admitted for increased SOB.  Found to have LUE VTE.  Goal of Therapy:  Heparin level = 0.3-0.7  Monitor platelets by anticoagulation protocol: Yes INR = 2-3   Plan:  Increase to 1650 units/hr and recheck in 8 hours  Janice Coffin  03/15/2012,5:16 AM

## 2012-03-15 NOTE — Progress Notes (Signed)
Dr. Malachi Bonds notified of c02 42. No new orders given

## 2012-03-15 NOTE — Progress Notes (Signed)
ANTICOAGULATION CONSULT NOTE - Follow Up Consult  Pharmacy Consult for Heaprin/coumadin Indication: LUE DVT  Allergies  Allergen Reactions  . Sulfa Antibiotics Rash  . Atorvastatin   . Penicillins   . Simvastatin     Patient Measurements: Height: 5\' 8"  (172.7 cm) Weight: 178 lb 9.2 oz (81 kg) IBW/kg (Calculated) : 68.4   Vital Signs: Temp: 96.8 F (36 C) (08/20 0700) Temp src: Axillary (08/20 0700) BP: 121/49 mmHg (08/20 0700) Pulse Rate: 66  (08/20 0455)  Labs:  Basename 03/15/12 0355 03/14/12 2230 03/14/12 1415 03/14/12 0800 03/13/12 1410 03/12/12 1553 03/12/12 1552  HGB 8.9* 8.8* -- -- -- -- --  HCT 29.7* 28.9* 26.3* -- -- -- --  PLT 209 195 192 -- -- -- --  APTT -- -- -- -- -- 32 --  LABPROT 27.5* -- -- 18.2* -- 16.4* --  INR 2.51* -- -- 1.48 -- 1.30 --  HEPARINUNFRC 0.19* -- -- 0.39 0.31 -- --  CREATININE 0.79 -- 0.51 0.83 -- -- --  CKTOTAL -- -- -- -- -- -- --  CKMB -- -- -- -- -- -- --  TROPONINI -- -- <0.30 -- -- -- <0.30    Estimated Creatinine Clearance: 67.7 ml/min (by C-G formula based on Cr of 0.79).   Assessment: 85yom NH resident admitted for increased SOB.  Found to have LUE VTE.  Heparin was subtherapeutic this AM. INR shot way up today 2.51. Pt did not get coumadin last night. Therefore, this jump is after only one dose of coumadin so far. He is very sensitive to coumadin prob due to a number of factors (CHF exacerbation, age,  Malnutrition, etc)  Goal of Therapy:  Heparin level = 0.3-0.7 Monitor platelets by anticoagulation protocol: Yes INR = 2-3   Plan:  Heparin at 1450 units/hr No coumadin today Daily CBC, HL, INR  Arlester Marker, [place] 03/15/2012,9:05 AM

## 2012-03-15 NOTE — Progress Notes (Signed)
CRITICAL VALUE ALERT  Critical value received: C02 42  Date of notification:  03/15/2012  Time of notification: 1830  Critical value read back:yes  Nurse who received alert:Kynzlee Hucker, RN  MD notified (1st page Dr. Malachi Bonds  Time of first page: 1900  MD notified (2nd page):  Time of second page:  Responding MD:Dr. Malachi Bonds  Time MD responded:[id number]

## 2012-03-15 NOTE — Progress Notes (Signed)
bipap dced per MD order...02 via n/c applied at 3 liters min. o2 sat 97% at this time. No c/o voiced. Family at the bedside.

## 2012-03-15 NOTE — Progress Notes (Signed)
CSW spoke with [facility]. Pt can return when medically stable pending bed availability. Pt payer sources is medicaid, and pt family unable to afford bed hold per day. Facility and csw continue to update each other on pt anticipated discharge to ensure patient can return. Pt family aware of risk of bed hold and having to find another skilled nursing facility if there is not bed availability when pt is medically stable for discharge.  At this time, [facility] does not anticipate [facility] not having a bed.   .Clinical social worker continuing to follow pt to assist with pt dc plans and further csw needs.    Catha Gosselin, Theresia Majors  [phone] .03/15/2012 1031am

## 2012-03-15 NOTE — Progress Notes (Signed)
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 Patient presents with respiratory failure likely due to CHF exacerbation, unknown trigger, although his lasix may have been weaned from twice daily to once daily over the last month.    CHF exacerbation with bilateral pleural effusions and hypercapneic respiratory failure. Increasing arrhythmias with decreasing urine output overnight, so weaning dobumatine.  Patient having increased work of breathing and continues to have rales and JVP to angle of mandible.  Patient in agreement to stop bipap and not restart- Strict I/Os  - Daily weights  - Restart diet - Goal of -2-3 L - Lasix 120mg  IV q6h - BMP with Magnesium q12h during aggressive diuresis - Metolazone prn decreased uop - Titrate milrinone for uop.  Currently 125 mcg/kg/min - Appreciate Cardiology recommendations -  Appreciate critical care recommendations -  Per discussion with family and patient, will not reapply bipap.   IF increasing CO2 retention, will make patient has comfortable as possible, consider DC gtt and give morphine for comfort.  Hypokalemia:  Likely due to diuretics -  Potassium po once.  Recheck qpm.  CAD (hypokinesis of basal-midinferior myocardium in setting of severe PVD), HTN, HL  - On ASA  - Consider adding low-dose beta-blocker after CHF  exac resolved - Holding statin and fibrate due to reported allergy to formulary meds and recent AKI   Bilateral pleural effusions, likely secondary to CHF exacerbation and transudative, however, may be contributing the respiratory distress. Patient appears to be improving with diuresis  -  No need for thoracentesis at this time per [facility]  Acute kidney injury, may be cardiorenal, but consider obstruction also. FEurea 33% (mixed) - Monitor creatinine (improved initially with diuresis) - Renally dose medications  - Avoid nephrotoxic agents   Left arm swelling with history of previous DVT, off AC.  DVT positive on Duplex today. - Will transition to lovenox after paracentesis - warfarin per pharmacy - Monitor closely for signs of bleeding given recent GIB and biopsy   Abdominal discomfort, likely due to gut edema, urinary retention, but also consider cardiac ascites +/- SBP (less likely), or mesenteric ischemia in a vasculopath (less likely given degree and timing of symptoms) .  [place] positive for ascites -  Diagnostic paracentesis if stable - Diuresis as above   Left leg ulcer with purulent drainage concerning for infection, however no fever or white count and no gross cellulitis  - Wound care consult  - Start broad spectrum abx if appears to worsen   Diabetes, fingerstick 108 upon arrival in ER.  Currently well-controlled - SSI low-coverage   Anemia, stable from prior. Likely not contributing to dyspnea  - Monitor for now   GERD  - Continue PPI but increase to BID since starting A/C after recent procedure.  DIET:  Diabetic diet  ACCESS:  PICC PROPH: Heparin gtt + coumadin + increase PPI to BID   Code Status:  DNR/DNI.  Do  not reapply bipap.  Continue diuresis, but if decompensates, make comfort care only. Family Communication: Spoke with family at arrival to ER Disposition Plan:  Pending stable respiratory status after adequate diuresis, plan for anticoagulation  outpatient.   Brief narrative:  The patient is an 78 year old male with history of CAD s/p MI without CABG or stent. He had worsening CHF with 2nd degree 2:1 heart block requiring pacemaker placement 12/03/2011. He also has diabetes, PVD s/p right AKA, left calf ulcer.  The patient was at his baseline with 2L home oxygen about 5 days ago. He has been becoming progressively more [name] of breath. Last night, he was lifted via [place] lift and when he was placed back in bed, he felt very [name] of breath and may have had an episode of syncope. He turned cyanotic and they turned up his oxygen. He had a CXR in the morning ordered by the nurse practitioner at the [facility] nursing home where he stays and it showed pulmonary edema and pleural effusions. He was transported to the [facility] for further evalaution. He has had a nonproductive cough, worsening swelling of the left arm, and a new fluid blister of the left leg which burst recently. He feels like drowning when he lies back. Dyspnea without exertion.  Per history, he has had a possible DVT in the left arm previously which was treated with coumadin. He has not been on coumadin for several weeks due to his GI bleed and because he recently underwent EGD with biopsy on 02/27/12.    Consultants:  Cardiology  Critical care  Procedures:  Abd [place] 8/18  PICC line 8/18  Antibiotics:  None  HPI/Subjective:  Patient denies chest pain, but indicates he is uncomfortable with bipap.  Increased uop overnight.  No diarrhea, fevers.       Objective: Filed Vitals:   03/15/12 0100 03/15/12 0200 03/15/12 0455 03/15/12 0700  BP: 126/49 116/49 112/42 121/49  Pulse: 92 98 66   Temp:   97.2 F (36.2 C) 96.8 F (36 C)  TempSrc:   Axillary Axillary  Resp: 15 16 14    Height:      Weight:   81 kg (178 lb 9.2 oz)   SpO2: 97% 97% 97%     Intake/Output Summary (Last 24 hours) at 03/15/12 0825 Last data filed at 03/15/12 0725  Gross per 24 hour   Intake 1761.48 ml  Output   3750 ml  Net -1988.52 ml   Filed Weights   03/13/12 0520 03/14/12 0400 03/15/12 0455  Weight: 85.8 kg (189 lb 2.5 oz) 83 kg (182 lb 15.7 oz) 81 kg (178 lb 9.2 oz)    Exam:   General:  Obese caucasian male, bipap in place  Neck:  JVP to the tragus while seated upright  Cardiovascular:  RRR, 1/6 systolic murmur at the LSB, S1, ps S2  Respiratory: Decreased rales (still to apex) and increased aeration  Abdomen: NABS, distended with mild fluid wave, tender to palpation along midline and suprapubic region without rebound or guarding  EXT:  Slow 3+ pitting edema all extremities, particularly buttucks, legs, and dependent side of arms Psych:  Alert and able to give history today.  Data Reviewed: Basic Metabolic Panel:  Lab 03/15/12 1478 03/14/12 1415 03/14/12 0800 03/13/12 1700 03/13/12 0830 03/13/12 0456  NA 140 140 140 139 -- 139  K 3.3* 3.5 3.3* 3.6 -- 3.2*  CL 93* 96 93* 93* -- 93*  CO2 42* 38* 40* 42* -- 38*  GLUCOSE 105* 139* 119* 101* -- 92  BUN 20 20 21 21  -- 22  CREATININE 0.79 0.51 0.83 0.75 -- 0.77  CALCIUM 9.4 8.7 9.3 9.3 -- 9.2  MG 1.9 1.8 1.9 1.9 1.5 --  PHOS -- 4.2 -- -- -- --   Liver Function Tests:  Lab 03/14/12 1415 03/13/12 0456 03/12/12 1553  AST 28 30 34  ALT 9 9 11   ALKPHOS 42 41 47  BILITOT 0.7 0.9 0.9  PROT 6.5 6.4 7.3  ALBUMIN 2.5* 2.5* 2.9*   No results found for this basename: LIPASE:5,AMYLASE:5 in the last 168 hours No results found for this basename: AMMONIA:5 in the last 168 hours CBC:  Lab 03/15/12 0355 03/14/12 2230 03/14/12 1415 03/14/12 0800 03/13/12 0456  WBC 6.0 6.0 5.5 6.4 7.1  NEUTROABS -- -- 4.4 -- --  HGB 8.9* 8.8* 7.6* 8.1* 8.1*  HCT 29.7* 28.9* 26.3* 27.6* 26.7*  MCV 76.3* 76.9* 79.0 77.5* 75.4*  PLT 209 195 192 199 197   Cardiac Enzymes:  Lab 03/14/12 1415 03/12/12 1552  CKTOTAL -- --  CKMB -- --  CKMBINDEX -- --  TROPONINI <0.30 <0.30   BNP (last 3 results)  Basename 03/12/12  1552 12/04/11 1053  PROBNP 23086.0* 6095.0*   CBG:  Lab 03/14/12 2205 03/14/12 1549 03/14/12 1220 03/14/12 0803 03/13/12 2114  GLUCAP 104* 111* 125* 113* 128*    Recent Results (from the past 240 hour(s))  MRSA PCR SCREENING     Status: Normal   Collection Time   03/12/12 11:12 PM      Component Value Range Status Comment   MRSA by PCR NEGATIVE  NEGATIVE Final      Studies: US Abdomen Limited  03/13/2012  *RADIOLOGY REPORT*  Clinical Data: Evaluate for ascites.  LIMITED ABDOMEN ULTRASOUND FOR ASCITES  Technique:  Limited ultrasound survey for ascites was performed in all four abdominal quadrants.  Comparison:  None.  Findings: Small to moderate volume abdominal and pelvic ascites. Incidental note is made of bilateral pleural effusions.  Liver not well evaluated.  IMPRESSION:  1.  Small to moderate volume abdominal pelvic ascites. 2.  Bilateral pleural effusions.  Original Report Authenticated By: Consuello Bossier, M.D.   Dg Chest Port 1 View  03/13/2012  *RADIOLOGY REPORT*  Clinical Data: Line placement.  PORTABLE CHEST - 1 VIEW  Comparison: 1 day prior  Findings: A right-sided PICC line followed to the level of the high right atrium. Pacer with leads at right atrium and right ventricle. No lead discontinuity.  No pneumothorax.  Moderate cardiomegaly.  Layering bilateral pleural effusions are not significantly changed.  Low lung volumes with slight improvement in mild interstitial edema.  Bibasilar airspace disease is similar.  IMPRESSION:  1.  Right-sided PICC line terminating over the high right atrium. If mid to low SVC position is desired, recommend retraction 5.8 cm. 2.  Slight improvement in mild congestive heart failure. 3.  Similar bilateral pleural effusions and bibasilar airspace disease, likely atelectasis.  Original Report Authenticated By: Consuello Bossier, M.D.   Dg Chest Portable 1 View  03/12/2012  *RADIOLOGY REPORT*  Clinical Data: 78 year old male with shortness of breath,  swelling.  PORTABLE CHEST - 1 VIEW  Comparison: 12/05/2011 and earlier.  Findings: Portable semi upright AP view at 1617 hours.  More lordotic view. Stable cardiomegaly and mediastinal contours. Stable left chest cardiac pacemaker.  Increase veiling opacity on the right.  No pneumothorax.  Stable vascular congestion, no overt pulmonary edema.  Continued bibasilar  opacity.  IMPRESSION: Interval increased right pleural effusion, probably moderate. Stable vascular congestion and basilar atelectasis.  Original Report Authenticated By: Harley Hallmark, M.D.    Scheduled Meds:    . antiseptic oral rinse  15 mL Mouth Rinse BID  . aspirin EC  81 mg Oral Daily  . docusate sodium  100 mg Oral BID  . dutasteride  0.5 mg Oral Daily  . feeding supplement  237 mL Oral BID BM  . FLUoxetine  30 mg Oral Daily  . fluticasone  2 spray Each Nare Daily  . furosemide  120 mg Intravenous Q6H  . heparin  2,000 Units Intravenous Once  . insulin aspart  0-5 Units Subcutaneous QHS  . insulin aspart  0-9 Units Subcutaneous TID WC  . iron polysaccharides  150 mg Oral Daily  . lidocaine  1 patch Transdermal Daily  . loratadine  5 mg Oral Daily  . metolazone  10 mg Oral Once  . multivitamin with minerals  1 tablet Oral Daily  . nitroGLYCERIN  1 inch Topical Q6H  . olopatadine  1 drop Both Eyes BID  . pantoprazole  40 mg Oral BID AC  . polyethylene glycol  17 g Oral Daily  . potassium chloride  40 mEq Oral Once  . senna-docusate  1 tablet Oral QHS  . sodium chloride  10-40 mL Intracatheter Q12H  . spironolactone  25 mg Oral BID  . warfarin  5 mg Oral ONCE-1800  . warfarin   Does not apply Once  . Warfarin - Pharmacist Dosing Inpatient   Does not apply q1800  . DISCONTD: enoxaparin (LOVENOX) injection  80 mg Subcutaneous Once  . DISCONTD: enoxaparin (LOVENOX) injection  80 mg Subcutaneous Once  . DISCONTD: enoxaparin (LOVENOX) injection  80 mg Subcutaneous Q12H  . DISCONTD: zolpidem  5 mg Oral QHS   Continuous  Infusions:    . sodium chloride 20 mL/hr (03/14/12 0522)  . sodium chloride 20 mL/hr (03/14/12 0527)  . heparin 1,650 Units/hr (03/15/12 0545)  . milrinone 0.125 mcg/kg/min (03/14/12 2041)  . DISCONTD: DOBUTamine Stopped (03/14/12 1703)  . DISCONTD: heparin 1,450 Units/hr (03/14/12 0523)    Principal Problem:  *Acute exacerbation of congestive heart failure Active Problems:  DIABETES MELLITUS, TYPE II  ANXIETY  HYPERTENSION  PERIPHERAL VASCULAR DISEASE  AV block, 2nd degree  Weakness  Anemia  Pleural effusion  Respiratory failure with hypercapnia  Acute kidney injury  Infected decubitus ulcer  Protein malnutrition    Time spent: 45    Concha Sudol, [facility]  [facility] Pager [phone]. If 8PM-8AM, please contact night-coverage at [email or web address], password [email or web address] 03/15/2012, 8:25 AM  LOS: 3 days

## 2012-03-15 NOTE — Progress Notes (Signed)
Patient Name: Ivan [name] Date of Encounter: 03/15/2012    SUBJECTIVE: Doing poorly. On BiPAP. Hypercapnia has been an issue. Chart reviewed with reference to comfort care/DNR, etc.  TELEMETRY:  Paced: Filed Vitals:   03/15/12 0200 03/15/12 0455 03/15/12 0700 03/15/12 0906  BP: 116/49 112/42 121/49   Pulse: 98 66  91  Temp:  97.2 F (36.2 C) 96.8 F (36 C)   TempSrc:  Axillary Axillary   Resp: 16 14  13   Height:      Weight:  81 kg (178 lb 9.2 oz)    SpO2: 97% 97%  97%    Intake/Output Summary (Last 24 hours) at 03/15/12 0944 Last data filed at 03/15/12 0842  Gross per 24 hour  Intake 1702.28 ml  Output   4100 ml  Net -2397.72 ml    LABS: Basic Metabolic Panel:  Basename 03/15/12 0355 03/14/12 1415  NA 140 140  K 3.3* 3.5  CL 93* 96  CO2 42* 38*  GLUCOSE 105* 139*  BUN 20 20  CREATININE 0.79 0.51  CALCIUM 9.4 8.7  MG 1.9 1.8  PHOS -- 4.2   CBC:  Basename 03/15/12 0355 03/14/12 2230 03/14/12 1415  WBC 6.0 6.0 --  NEUTROABS -- -- 4.4  HGB 8.9* 8.8* --  HCT 29.7* 28.9* --  MCV 76.3* 76.9* --  PLT 209 195 --   Cardiac Enzymes:  Basename 03/14/12 1415 03/12/12 1552  CKTOTAL -- --  CKMB -- --  CKMBINDEX -- --  TROPONINI <0.30 <0.30   Radiology/Studies:  RADIOLOGY REPORT*  Clinical Data: Increasing shortness of breath and weakness  PORTABLE CHEST - 1 VIEW  Comparison: Portable chest x-ray of 03/06/2012  Findings: The lungs are poorly aerated. There is cardiomegaly  present with effusions and airspace disease most consistent with  edema. A Port-A-Cath and right-sided central venous line remain.  IMPRESSION:  Diminished aeration with worsening of probable CHF with effusions.  Original Report Authenticated By: Juline Patch, M.D.   ECHO: ------------------------------------------------------------ Study Conclusions  - Left ventricle: Systolic function was severely reduced. The estimated ejection fraction was in the range of 25% to 30%. -  Ventricular septum: Septal motion showed abnormal function and dyssynergy. - Aortic valve: Valve area: 1.42cm^2(VTI). Valve area: 1.39cm^2 (Vmax). - Mitral valve: Calcified annulus. Mildly thickened leaflets . - Left atrium: The atrium was mildly dilated. - Right ventricle: The cavity size was mildly dilated. Systolic function was mildly reduced. - Right atrium: The atrium was mildly dilated. - Pulmonary arteries: Systolic pressure was mildly to moderately increased.  Physical Exam: Blood pressure 121/49, pulse 91, temperature 96.8 F (36 C), temperature source Axillary, resp. rate 13, height 5\' 8"  (1.727 m), weight 81 kg (178 lb 9.2 oz), SpO2 97.00%. Weight change: -2 kg (-4 lb 6.6 oz)   No obvious gallop  Lungs clear anteriorly  Somnolent, on BiPAP  ASSESSMENT:  1. Acute systolic heart failure with documentation of decline in LVEF since May of 2013 been documented to be 45-50%.  2. Hypercapnic respiratory failure, related to CHF plus other issues possibly including pulmonary emboli.  3. Recent high-grade AV block requiring DDD pacemaker  4. Left subclavian DVT secondary to indwelling DDD pacemaker and systolic heart failure.  5. Hypertension  Plan:  1. Continue diuresis  2. Consider therapeutic thoracentesis  3. Consider pulmonary consult with reference to hypercapnia  4. I generally agree with the approach and to provide comfort care if the patient fails reasonable measures to reverse respiratory failure/heart failure.  5. Long-term Coumadin anticoagulation will be recommended as propensity for left subclavian vein thrombosis will persist indefinitely due to the pacemaker. Ivan [name] 03/15/2012, 9:44 AM

## 2012-03-15 NOTE — Progress Notes (Signed)
PCCM follow up  Seems a little more awake today. Note discussions with family regarding DNR status, desire to avoid BiPAP. Agree with comfort measures if he declines. CCM will sign off. Please call if we can help you.   Levy Pupa, MD, PhD 03/15/2012, 12:26 PM [facility] [phone] or if no answer [phone]

## 2012-03-15 NOTE — Progress Notes (Signed)
ANTICOAGULATION CONSULT NOTE - Follow Up Consult  Pharmacy Consult for Heaprin/coumadin Indication: LUE DVT  Allergies  Allergen Reactions  . Sulfa Antibiotics Rash  . Atorvastatin   . Penicillins   . Simvastatin     Patient Measurements: Height: 5\' 8"  (172.7 cm) Weight: 178 lb 9.2 oz (81 kg) IBW/kg (Calculated) : 68.4   Vital Signs: Temp: 97.6 F (36.4 C) (08/20 1210) Temp src: Oral (08/20 1210) BP: 122/44 mmHg (08/20 1210) Pulse Rate: 91  (08/20 0906)  Labs:  Basename 03/15/12 1355 03/15/12 0355 03/14/12 2230 03/14/12 1415 03/14/12 0800 03/12/12 1553 03/12/12 1552  HGB -- 8.9* 8.8* -- -- -- --  HCT -- 29.7* 28.9* 26.3* -- -- --  PLT -- 209 195 192 -- -- --  APTT -- -- -- -- -- 32 --  LABPROT -- 27.5* -- -- 18.2* 16.4* --  INR -- 2.51* -- -- 1.48 1.30 --  HEPARINUNFRC 0.43 0.19* -- -- 0.39 -- --  CREATININE -- 0.79 -- 0.51 0.83 -- --  CKTOTAL -- -- -- -- -- -- --  CKMB -- -- -- -- -- -- --  TROPONINI -- -- -- <0.30 -- -- <0.30    Estimated Creatinine Clearance: 67.7 ml/min (by C-G formula based on Cr of 0.79).   Assessment: 85yom NH resident admitted for increased SOB.  Found to have LUE VTE.  Heparin level is now therapeutic.  Goal of Therapy:  Heparin level = 0.3-0.7 Monitor platelets by anticoagulation protocol: Yes INR = 2-3   Plan:  Heparin at 1450 units/hr No coumadin today Daily CBC, HL, INR  Ulyses [place] 03/15/2012,3:43 PM

## 2012-03-15 NOTE — Progress Notes (Addendum)
CRITICAL VALUE ALERT  Critical value received: CO2-42.0  Date of notification:  03/15/12  Time of notification:  0545  Critical value read back: yes  Nurse who received alert:  M.Foster Simpson, RN  MD notified (1st page): Short, MD  Time of first page: [phone]  MD notified (2nd page):  Time of second page:  AM nurse aware of critical value. Will monitor  Responding MD:    Time MD responded:

## 2012-03-16 DIAGNOSIS — E119 Type 2 diabetes mellitus without complications: Secondary | ICD-10-CM

## 2012-03-16 DIAGNOSIS — D649 Anemia, unspecified: Secondary | ICD-10-CM

## 2012-03-16 LAB — BASIC METABOLIC PANEL
CO2: >45 mEq/L (ref 19–32)
CO2: 44 mEq/L (ref 19–32)
Calcium: 8.5 mg/dL (ref 8.4–10.5)
Chloride: 87 mEq/L — ABNORMAL LOW (ref 96–112)
Creatinine, Ser: 0.85 mg/dL (ref 0.50–1.35)
GFR calc non Af Amer: 83 mL/min — ABNORMAL LOW (ref >90)
Glucose, Bld: 129 mg/dL — ABNORMAL HIGH (ref 70–99)
Potassium: 2.9 mEq/L — ABNORMAL LOW (ref 3.5–5.1)
Sodium: 135 mEq/L (ref 135–145)

## 2012-03-16 LAB — PROTIME-INR: INR: 2.35 — ABNORMAL HIGH (ref 0.00–1.49)

## 2012-03-16 LAB — HEPARIN LEVEL (UNFRACTIONATED): Heparin Unfractionated: 0.62 IU/mL (ref 0.30–0.70)

## 2012-03-16 LAB — CBC
HCT: 29.3 % — ABNORMAL LOW (ref 39.0–52.0)
MCH: 23.3 pg — ABNORMAL LOW (ref 26.0–34.0)
MCV: 75.9 fL — ABNORMAL LOW (ref 78.0–100.0)
RDW: 17.5 % — ABNORMAL HIGH (ref 11.5–15.5)
WBC: 5.9 10*3/uL (ref 4.0–10.5)

## 2012-03-16 LAB — GLUCOSE, CAPILLARY
Glucose-Capillary: 112 mg/dL — ABNORMAL HIGH (ref 70–99)
Glucose-Capillary: 196 mg/dL — ABNORMAL HIGH (ref 70–99)
Glucose-Capillary: 144 mg/dL — ABNORMAL HIGH (ref 70–99)

## 2012-03-16 LAB — MAGNESIUM: Magnesium: 1.6 mg/dL (ref 1.5–2.5)

## 2012-03-16 MED ORDER — HEPARIN (PORCINE) IN NACL 100-0.45 UNIT/ML-% IJ SOLN
1850.0000 [IU]/h | INTRAMUSCULAR | Status: DC
  Administered 2012-03-16 – 2012-03-17 (×2): 1550 [IU]/h via INTRAVENOUS
  Administered 2012-03-18: 1650 [IU]/h via INTRAVENOUS
  Administered 2012-03-18: 1550 [IU]/h via INTRAVENOUS
  Administered 2012-03-19: 1750 [IU]/h via INTRAVENOUS
  Filled 2012-03-16 (×10): qty 250

## 2012-03-16 MED ORDER — WARFARIN SODIUM 2 MG PO TABS
2.0000 mg | ORAL_TABLET | Freq: Once | ORAL | Status: AC
  Administered 2012-03-16: 2 mg via ORAL
  Filled 2012-03-16: qty 1

## 2012-03-16 MED ORDER — POTASSIUM CHLORIDE CRYS ER 20 MEQ PO TBCR
40.0000 meq | EXTENDED_RELEASE_TABLET | Freq: Two times a day (BID) | ORAL | Status: AC
  Administered 2012-03-16 – 2012-03-17 (×2): 40 meq via ORAL
  Filled 2012-03-16 (×2): qty 2

## 2012-03-16 NOTE — Progress Notes (Signed)
CRITICAL VALUE ALERT  Critical value received:CO2 44  Date of notification:03/16/2012  Time of notification: 1735  Critical value read back:yes  Nurse who received alert: Lora Paula, RN  MD notified (1st page): Dr. Irene Limbo  Time of first page: 1545  MD notified (2nd page):  Time of second page:  Responding MD:  Dr. Irene Limbo  Time MD responded: [phone]

## 2012-03-16 NOTE — Progress Notes (Signed)
Foam dressing intact to sacram..abrasion noted to lt. Lower leg and 1 foam removed from sacram and reddened area noted. No obvious skin breakdown. Foam dressing reapllied.

## 2012-03-16 NOTE — Progress Notes (Addendum)
ANTICOAGULATION CONSULT NOTE - Follow Up Consult  Pharmacy Consult for Heaprin/coumadin Indication: LUE DVT  Allergies  Allergen Reactions  . Sulfa Antibiotics Rash  . Atorvastatin   . Penicillins   . Simvastatin     Patient Measurements: Height: 5\' 8"  (172.7 cm) Weight: 176 lb 5.9 oz (80 kg) IBW/kg (Calculated) : 68.4   Vital Signs: Temp: 98.3 F (36.8 C) (08/21 0729) Temp src: Oral (08/21 0729) BP: 96/42 mmHg (08/21 0729) Pulse Rate: 78  (08/21 0729)  Labs:  Basename 03/16/12 0515 03/15/12 1710 03/15/12 1355 03/15/12 0355 03/14/12 2230 03/14/12 1415 03/14/12 0800  HGB 9.0* -- -- 8.9* -- -- --  HCT 29.3* -- -- 29.7* 28.9* -- --  PLT 207 -- -- 209 195 -- --  APTT -- -- -- -- -- -- --  LABPROT 26.1* -- -- 27.5* -- -- 18.2*  INR 2.35* -- -- 2.51* -- -- 1.48  HEPARINUNFRC 0.62 -- 0.43 0.19* -- -- --  CREATININE 0.85 0.73 -- 0.79 -- -- --  CKTOTAL -- -- -- -- -- -- --  CKMB -- -- -- -- -- -- --  TROPONINI -- -- -- -- -- <0.30 --    Estimated Creatinine Clearance: 63.7 ml/min (by C-G formula based on Cr of 0.85).   Assessment: 85yom NH resident admitted for increased SOB.  Found to have LUE VTE.  Heparin level is therapeutic this AM. INR 2.35 after holding coumadin for 2 days. Pt is very sensitive to coumadin. Recent GIB so has to be very cautious with dosing. H/H holding stable. D4/5 overlap  Goal of Therapy:  Heparin level = 0.3-0.7 Monitor platelets by anticoagulation protocol: Yes INR = 2-3   Plan:  Decrease heparin to 1550 units/hr Coumadin 2mg  PO x1 Daily CBC, HL, INR  Ulyses [place] 03/16/2012,8:45 AM

## 2012-03-16 NOTE — Progress Notes (Signed)
Dr. Betti Cruz paged with c02 greater than 45. No orders given. Pt. On 3 liters per n/c and 02 saturation 98%.Family at the bedside.

## 2012-03-16 NOTE — Progress Notes (Signed)
Patient Name: Ivan [name] Date of Encounter: 03/16/2012    SUBJECTIVE: No dyspnea. He denies chest pain and there is no current complain other than buttocks pain.  TELEMETRY:  AV seq pacing. Filed Vitals:   03/16/12 0630 03/16/12 0729 03/16/12 1211 03/16/12 1650  BP:  96/42 111/64 125/46  Pulse:  78 73 81  Temp:  98.3 F (36.8 C) 98.1 F (36.7 C) 98.1 F (36.7 C)  TempSrc:  Oral Axillary Axillary  Resp:  21 21 16   Height:      Weight: 80 kg (176 lb 5.9 oz)     SpO2:  99% 99% 99%    Intake/Output Summary (Last 24 hours) at 03/16/12 1921 Last data filed at 03/16/12 1651  Gross per 24 hour  Intake 1332.6 ml  Output   2850 ml  Net -1517.4 ml    LABS: Basic Metabolic Panel:  Basename 03/16/12 1629 03/16/12 0515 03/14/12 1415  NA 135 135 --  K 2.9* 3.4* --  CL 88* 87* --  CO2 44* >45* --  GLUCOSE 199* 129* --  BUN 18 19 --  CREATININE 0.75 0.85 --  CALCIUM 8.5 9.3 --  MG 1.6 1.7 --  PHOS -- -- 4.2   CBC:  Basename 03/16/12 0515 03/15/12 0355 03/14/12 1415  WBC 5.9 6.0 --  NEUTROABS -- -- 4.4  HGB 9.0* 8.9* --  HCT 29.3* 29.7* --  MCV 75.9* 76.3* --  PLT 207 209 --   Cardiac Enzymes:  Basename 03/14/12 1415  CKTOTAL --  CKMB --  CKMBINDEX --  TROPONINI <0.30    Radiology/Studies:   No new  Physical Exam: Blood pressure 125/46, pulse 81, temperature 98.1 F (36.7 C), temperature source Axillary, resp. rate 16, height 5\' 8"  (1.727 m), weight 80 kg (176 lb 5.9 oz), SpO2 99.00%. Weight change: -1 kg (-2 lb 3.3 oz)   Mild stump edema  Decreased abd girth  No obvious gallop.  ASSESSMENT:  1. Acute on chronic systolic heart failure  2. Acute on chronic resp failure  Plan:  1. Continue aggressive diuresis  Signed, Lesleigh Noe 03/16/2012, 7:21 PM

## 2012-03-16 NOTE — Progress Notes (Signed)
CRITICAL VALUE ALERT  Critical value received:co2 greater than 45  Date of notification: 03/16/2012  Time of notification: 0740  Critical value read back:yes  Nurse who received alert: Lora Paula, RN  MD notified (1st page): Dr. Betti Cruz  Time of first page:  [phone]  MD notified (2nd page):  Time of second page:  Responding MD: Dr. Betti Cruz  Time MD responded:0745

## 2012-03-16 NOTE — Consult Note (Signed)
Dr. Irene Limbo notified of co2 44 and orders put in regarding low K+. Pt. To be transferred to telemetry. Report called.

## 2012-03-16 NOTE — Progress Notes (Signed)
[facility] PROGRESS NOTE  WEBB WEED ZOX:[id number] DOB: 04-10-1927 DOA: 03/12/2012 PCP: Oliver Barre, MD  Assessment/Plan: 1. Acute systolic CHF--with bilateral pleural effusions and hypercapneic respiratory failure. Improved with milrinone, Lasix. Appreciate Cardiology and pulmonary recommendations Per discussion with family and patient, will not reapply bipap. IF increasing CO2 retention, will make patient has comfortable as possible.  2. Bilateral pleural effusions, likely secondary to CHF exacerbation and transudative. Patient appears to be improving with diuresis. No need for thoracentesis at this time per PCCM.  3. CAD--Continue ASA. Holding statin and fibrate due to reported allergy to formulary meds and recent AKI.  4. Hypokalemia: Likely due to diuretic. Replete.  5. Acute kidney injury, resolved. 6. LUE DVT--continue heparin and warfarin per pharmacy. Monitor for signs of bleeding given recent GIB and biopsy  7. Abdominal discomfort--appears resolved s/p paracentesis.  8. Left leg ulcer--Wound care. 9. Diabetes--SSI low-coverage  10. Anemia, stable from prior. Likely not contributing to dyspnea  11. GERD--Continue PPI.  Code Status: DNR/DNI. Do not reapply bipap. Continue diuresis, but if decompensates, make comfort care only.  Family Communication: discussed with daughter and granddaughter at bedsdie Disposition Plan: Pending stable respiratory status after adequate diuresis, plan for anticoagulation outpatient.  [name], MD  [facility] Team 8 Pager [phone]. If 8PM-8AM, please contact night-coverage at [email or web address], password [facility] 03/16/2012, 12:34 PM  LOS: 4 days   Brief narrative:  85yom with history of CAD s/p MI. He has been becoming progressively more short of breath. He had a CXR in the morning ordered by the nurse practitioner at the [facility] nursing home where he stays and it showed pulmonary edema and pleural effusions. He was transported to  the [facility] for further evalaution. He has had a nonproductive cough, worsening swelling of the left arm, and a new fluid blister of the left leg which burst recently. He feels like drowning when he lies back. Dyspnea without exertion.  Per history, he has had a possible DVT in the left arm previously which was treated with coumadin. He has not been on coumadin for several weeks due to his GI bleed and because he recently underwent EGD with biopsy on 02/27/12.   Consultants:  Cardiology  Critical care Procedures:  Abd [place] 8/18  PICC line 8/18  HPI/Subjective: Complains of nasal congestion, otherwise feels ok . No pain. Breathing ok. Discussed with RN--has done well off BiPAP. No concerns.  Objective: Filed Vitals:   03/16/12 0421 03/16/12 0630 03/16/12 0729 03/16/12 1211  BP: 116/46  96/42 111/64  Pulse: 80  78 73  Temp:   98.3 F (36.8 C) 98.1 F (36.7 C)  TempSrc:   Oral Axillary  Resp:   21 21  Height:      Weight:  80 kg (176 lb 5.9 oz)    SpO2: 97%  99% 99%    Intake/Output Summary (Last 24 hours) at 03/16/12 1234 Last data filed at 03/16/12 1214  Gross per 24 hour  Intake 1775.3 ml  Output   3950 ml  Net -2174.7 ml    Exam:   General:  Appears calm and comfortable.  Cardiovascular: RRR, no m/r/g. 2+ LE edema  Respiratory: CTA bilaterally, no w/r/r. Normal respiratory effort.  Abdomen: soft, ntnd  Data Reviewed: Basic Metabolic Panel:  Lab 03/16/12 8295 03/15/12 1710 03/15/12 0355 03/14/12 1415 03/14/12 0800  NA 135 137 140 140 140  K 3.4* 3.2* 3.3* 3.5 3.3*  CL 87* 92* 93* 96 93*  CO2 >45* 42*  42* 38* 40*  GLUCOSE 129* 120* 105* 139* 119*  BUN 19 18 20 20 21   CREATININE 0.85 0.73 0.79 0.51 0.83  CALCIUM 9.3 8.4 9.4 8.7 9.3  MG 1.7 1.6 1.9 1.8 1.9  PHOS -- -- -- 4.2 --   Liver Function Tests:  Lab 03/14/12 1415 03/13/12 0456 03/12/12 1553  AST 28 30 34  ALT 9 9 11   ALKPHOS 42 41 47  BILITOT 0.7 0.9 0.9  PROT 6.5 6.4 7.3  ALBUMIN 2.5*  2.5* 2.9*   CBC:  Lab 03/16/12 0515 03/15/12 0355 03/14/12 2230 03/14/12 1415 03/14/12 0800  WBC 5.9 6.0 6.0 5.5 6.4  NEUTROABS -- -- -- 4.4 --  HGB 9.0* 8.9* 8.8* 7.6* 8.1*  HCT 29.3* 29.7* 28.9* 26.3* 27.6*  MCV 75.9* 76.3* 76.9* 79.0 77.5*  PLT 207 209 195 192 199   Cardiac Enzymes:  Lab 03/14/12 1415 03/12/12 1552  CKTOTAL -- --  CKMB -- --  CKMBINDEX -- --  TROPONINI <0.30 <0.30    Basename 03/12/12 1552 12/04/11 1053  PROBNP 23086.0* 6095.0*   CBG:  Lab 03/16/12 0822 03/15/12 2141 03/15/12 1737 03/15/12 1240 03/15/12 0837  GLUCAP 112* 160* 130* 122* 105*    Recent Results (from the past 240 hour(s))  MRSA PCR SCREENING     Status: Normal   Collection Time   03/12/12 11:12 PM      Component Value Range Status Comment   MRSA by PCR NEGATIVE  NEGATIVE Final      Studies: US Abdomen Limited  03/13/2012  *RADIOLOGY REPORT*  Clinical Data: Evaluate for ascites.  LIMITED ABDOMEN ULTRASOUND FOR ASCITES  Technique:  Limited ultrasound survey for ascites was performed in all four abdominal quadrants.  Comparison:  None.  Findings: Small to moderate volume abdominal and pelvic ascites. Incidental note is made of bilateral pleural effusions.  Liver not well evaluated.  IMPRESSION:  1.  Small to moderate volume abdominal pelvic ascites. 2.  Bilateral pleural effusions.  Original Report Authenticated By: Consuello Bossier, M.D.   Dg Chest Port 1 View  03/14/2012  *RADIOLOGY REPORT*  Clinical Data: Increasing shortness of breath and weakness  PORTABLE CHEST - 1 VIEW  Comparison: Portable chest x-ray of 03/06/2012  Findings: The lungs are poorly aerated.  There is cardiomegaly present with effusions and airspace disease most consistent with edema.  A Port-A-Cath and right-sided central venous line remain.  IMPRESSION: Diminished aeration with worsening of probable CHF with effusions.   Original Report Authenticated By: Juline Patch, M.D.    Scheduled Meds:   . antiseptic oral rinse   15 mL Mouth Rinse BID  . aspirin EC  81 mg Oral Daily  . docusate sodium  100 mg Oral BID  . dutasteride  0.5 mg Oral Daily  . feeding supplement  237 mL Oral BID BM  . FLUoxetine  30 mg Oral Daily  . fluticasone  2 spray Each Nare Daily  . furosemide  120 mg Intravenous Q6H  . insulin aspart  0-5 Units Subcutaneous QHS  . insulin aspart  0-9 Units Subcutaneous TID WC  . iron polysaccharides  150 mg Oral Daily  . lidocaine  1 patch Transdermal Daily  . loratadine  5 mg Oral Daily  . multivitamin with minerals  1 tablet Oral Daily  . nitroGLYCERIN  1 inch Topical Q6H  . olopatadine  1 drop Both Eyes BID  . pantoprazole  40 mg Oral BID AC  . polyethylene glycol  17 g Oral  Daily  . senna-docusate  1 tablet Oral QHS  . sodium chloride  10-40 mL Intracatheter Q12H  . spironolactone  25 mg Oral BID  . warfarin  2 mg Oral ONCE-1800  . warfarin   Does not apply Once  . Warfarin - Pharmacist Dosing Inpatient   Does not apply q1800  . white petrolatum       Continuous Infusions:   . sodium chloride 20 mL/hr (03/14/12 0522)  . sodium chloride 20 mL/hr (03/14/12 0527)  . heparin 1,550 Units/hr (03/16/12 0858)  . milrinone 0.125 mcg/kg/min (03/15/12 1658)  . DISCONTD: heparin 1,650 Units/hr (03/16/12 9604)    Principal Problem:  *Acute exacerbation of congestive heart failure Active Problems:  DIABETES MELLITUS, TYPE II  ANXIETY  HYPERTENSION  PERIPHERAL VASCULAR DISEASE  AV block, 2nd degree  Weakness  Anemia  Pleural effusion  Respiratory failure with hypercapnia  Acute kidney injury  Infected decubitus ulcer  Protein malnutrition     [name], MD  [facility] Team 8 Pager [phone]. If 8PM-8AM, please contact night-coverage at [email or web address], password [facility] 03/16/2012, 12:34 PM  LOS: 4 days   Time spent: 40 minutes

## 2012-03-17 DIAGNOSIS — F411 Generalized anxiety disorder: Secondary | ICD-10-CM

## 2012-03-17 DIAGNOSIS — E1169 Type 2 diabetes mellitus with other specified complication: Secondary | ICD-10-CM

## 2012-03-17 LAB — BASIC METABOLIC PANEL
BUN: 19 mg/dL (ref 6–23)
GFR calc Af Amer: >90 mL/min (ref >90)
GFR calc non Af Amer: 83 mL/min — ABNORMAL LOW (ref >90)
Potassium: 3.6 mEq/L (ref 3.5–5.1)

## 2012-03-17 LAB — GLUCOSE, CAPILLARY
Glucose-Capillary: 117 mg/dL — ABNORMAL HIGH (ref 70–99)
Glucose-Capillary: 149 mg/dL — ABNORMAL HIGH (ref 70–99)

## 2012-03-17 LAB — CBC
HCT: 30.7 % — ABNORMAL LOW (ref 39.0–52.0)
MCH: 23 pg — ABNORMAL LOW (ref 26.0–34.0)
MCHC: 30.9 g/dL (ref 30.0–36.0)
MCV: 74.3 fL — ABNORMAL LOW (ref 78.0–100.0)
RDW: 17.8 % — ABNORMAL HIGH (ref 11.5–15.5)

## 2012-03-17 MED ORDER — GLUCERNA SHAKE PO LIQD
237.0000 mL | Freq: Three times a day (TID) | ORAL | Status: DC
  Administered 2012-03-17 – 2012-03-24 (×17): 237 mL via ORAL
  Filled 2012-03-17 (×3): qty 237

## 2012-03-17 MED ORDER — ZOLPIDEM TARTRATE 5 MG PO TABS
5.0000 mg | ORAL_TABLET | Freq: Every evening | ORAL | Status: DC | PRN
  Administered 2012-03-17 – 2012-03-23 (×7): 5 mg via ORAL
  Filled 2012-03-17 (×7): qty 1

## 2012-03-17 MED ORDER — POTASSIUM CHLORIDE 20 MEQ/15ML (10%) PO LIQD
20.0000 meq | Freq: Once | ORAL | Status: AC
  Administered 2012-03-17: 20 meq via ORAL
  Filled 2012-03-17: qty 15

## 2012-03-17 MED ORDER — WARFARIN SODIUM 2.5 MG PO TABS
2.5000 mg | ORAL_TABLET | Freq: Once | ORAL | Status: AC
  Administered 2012-03-17: 2.5 mg via ORAL
  Filled 2012-03-17: qty 1

## 2012-03-17 MED ORDER — LORAZEPAM 0.5 MG PO TABS
0.5000 mg | ORAL_TABLET | Freq: Two times a day (BID) | ORAL | Status: DC | PRN
  Administered 2012-03-21 – 2012-03-23 (×3): 0.5 mg via ORAL
  Filled 2012-03-17 (×3): qty 1

## 2012-03-17 MED ORDER — MAGNESIUM SULFATE 40 MG/ML IJ SOLN
2.0000 g | Freq: Once | INTRAMUSCULAR | Status: AC
  Administered 2012-03-17: 2 g via INTRAVENOUS
  Filled 2012-03-17: qty 50

## 2012-03-17 NOTE — Progress Notes (Signed)
Patient Name: Ivan [name] Date of Encounter: 03/17/2012    SUBJECTIVE: He feels better. He denies shortness of breath. He requested that I call his daughter and speak to her.  TELEMETRY:  Paced rhythm Filed Vitals:   03/16/12 1950 03/16/12 2112 03/17/12 0416 03/17/12 1053  BP: 111/55 123/53 116/53 115/64  Pulse: 81 81 73 71  Temp: 98.3 F (36.8 C) 98.6 F (37 C) 97.7 F (36.5 C) 97.4 F (36.3 C)  TempSrc: Oral Oral Oral Oral  Resp: 18 18 18 20   Height: 5\' 8"  (1.727 m)     Weight: 79.9 kg (176 lb 2.4 oz)  79.8 kg (175 lb 14.8 oz)   SpO2: 96% 97% 100% 99%    Intake/Output Summary (Last 24 hours) at 03/17/12 1301 Last data filed at 03/17/12 0219  Gross per 24 hour  Intake    250 ml  Output   1750 ml  Net  -1500 ml  Net since admission: -7354  LABS: Basic Metabolic Panel:  Basename 03/17/12 0900 03/16/12 1629 03/16/12 0515 03/14/12 1415  NA 137 135 -- --  K 3.6 2.9* -- --  CL 86* 88* -- --  CO2 >45* 44* -- --  GLUCOSE 122* 199* -- --  BUN 19 18 -- --  CREATININE 0.74 0.75 -- --  CALCIUM 9.5 8.5 -- --  MG -- 1.6 1.7 --  PHOS -- -- -- 4.2   CBC:  Basename 03/17/12 0800 03/16/12 0515 03/14/12 1415  WBC 6.4 5.9 --  NEUTROABS -- -- 4.4  HGB 9.5* 9.0* --  HCT 30.7* 29.3* --  MCV 74.3* 75.9* --  PLT 217 207 --   Cardiac Enzymes:  Basename 03/14/12 1415  CKTOTAL --  CKMB --  CKMBINDEX --  TROPONINI <0.30  Radiology/Studies:  No new  Physical Exam: Blood pressure 115/64, pulse 71, temperature 97.4 F (36.3 C), temperature source Oral, resp. rate 20, height 5\' 8"  (1.727 m), weight 79.8 kg (175 lb 14.8 oz), SpO2 99.00%. Weight change: -0.1 kg (-3.5 oz)   Summation gallop.  Clear lungs anteriorly.  Neurologically more alert today. Skin color is normal. The patient is conversant  ASSESSMENT:  1. Acute on chronic combined systolic and diastolic heart failure, improving with diuresis.   Plan:  1. Continue until evidence of azotemia  Signed, Lesleigh Noe 03/17/2012, 1:01 PM

## 2012-03-17 NOTE — Progress Notes (Signed)
CSW informed unit csw of patient transfer to [id number] unit. CSW sent csw colleague hand off report regarding pt dc plans to [facility] when medically stable. Please contact unit csw Amy for further csw needs, and dc plans.   Catha Gosselin, Theresia Majors  [phone] .03/17/2012 947am

## 2012-03-17 NOTE — Progress Notes (Addendum)
Pt had 7 and 9 beat VTach vs paced.  Asymptomatic, vitals wnl, MD on call text paged.  Denies pain, no distress noted.  Potassium 2.9 at 1900, po potassium administered as ordered.  Will continue to monitor

## 2012-03-17 NOTE — Progress Notes (Signed)
Pharmacist Heart Failure Core Measure Documentation  Assessment: Ivan [name] has an EF documented as 25% on 8/18 by ECHO.  Rationale: Heart failure patients with left ventricular systolic dysfunction (LVSD) and an EF < 40% should be prescribed an angiotensin converting enzyme inhibitor (ACEI) or angiotensin receptor blocker (ARB) at discharge unless a contraindication is documented in the medical record.  This patient is not currently on an ACEI or ARB for HF.  This note is being placed in the record in order to provide documentation that a contraindication to the use of these agents is present for this encounter.  ACE Inhibitor or Angiotensin Receptor Blocker is contraindicated (specify all that apply)  []   ACEI allergy AND ARB allergy []   Angioedema []   Moderate or severe aortic stenosis []   Hyperkalemia []   Hypotension []   Renal artery stenosis [x]   Worsening renal function, preexisting renal disease or dysfunction Pt is also on milrinone for palliation.  [name] 03/17/2012 3:39 PM

## 2012-03-17 NOTE — Progress Notes (Signed)
ANTICOAGULATION CONSULT NOTE - Follow Up Consult  Pharmacy Consult for Heaprin/coumadin Indication: LUE DVT  Allergies  Allergen Reactions  . Sulfa Antibiotics Rash  . Atorvastatin   . Penicillins   . Simvastatin     Patient Measurements: Height: 5\' 8"  (172.7 cm) Weight: 175 lb 14.8 oz (79.8 kg) (bed scale pt unable to stand) IBW/kg (Calculated) : 68.4   Vital Signs: Temp: 97.4 F (36.3 C) (08/22 1053) Temp src: Oral (08/22 1053) BP: 115/64 mmHg (08/22 1053) Pulse Rate: 71  (08/22 1053)  Labs:  Basename 03/17/12 0900 03/17/12 0852 03/17/12 0800 03/16/12 1629 03/16/12 0515 03/15/12 1355 03/15/12 0355 03/14/12 1415  HGB -- -- 9.5* -- 9.0* -- -- --  HCT -- -- 30.7* -- 29.3* -- 29.7* --  PLT -- -- 217 -- 207 -- 209 --  APTT -- -- -- -- -- -- -- --  LABPROT -- 20.6* -- -- 26.1* -- 27.5* --  INR -- 1.73* -- -- 2.35* -- 2.51* --  HEPARINUNFRC -- 0.33 -- -- 0.62 0.43 -- --  CREATININE 0.74 -- -- 0.75 0.85 -- -- --  CKTOTAL -- -- -- -- -- -- -- --  CKMB -- -- -- -- -- -- -- --  TROPONINI -- -- -- -- -- -- -- <0.30    Estimated Creatinine Clearance: 67.7 ml/min (by C-G formula based on Cr of 0.74).   Assessment: 85yom NH resident admitted for increased SOB.  Found to have LUE VTE.  Heparin level is 0.3 therapeutic this AM. INR fell 1.73 after holding coumadin for 2 days. Pt is very sensitive to coumadin. Recent GIB so has to be very cautious with dosing. H/H holding stable. D4/5 overlap  Goal of Therapy:  Heparin level = 0.3-0.7 Monitor platelets by anticoagulation protocol: Yes INR = 2-3   Plan:  Continue heparin to 1550 units/hr Coumadin 2.5mg  PO x1 Daily CBC, HL, INR  Carney Corners.D. 03/17/2012,12:21 PM

## 2012-03-17 NOTE — Progress Notes (Signed)
CRITICAL VALUE ALERT  Critical value received: co2  Date of notification:  03/17/2012  Time of notification: 0945  Critical value read back:yes  Nurse who received alert: Joycie Peek RN  MD notified (1st page): Irene Limbo  Time of first RUEA:[phone]  MD notified (2nd page):  Time of second page:  Responding WJ:[id number] Time MD responded:  [phone]

## 2012-03-17 NOTE — Progress Notes (Signed)
[facility] PROGRESS NOTE  Ivan [name]:[id number] DOB: 13-Jan-1927 DOA: 03/12/2012 PCP: Oliver Barre, MD  Assessment/Plan: 1. Acute systolic CHF--continues to improve. Continue Lasix and milrinone per cardiology. Appreciate Cardiology and pulmonary recommendations Per discussion with family and patient, will not reapply bipap. If increasing CO2 retention, will make patient has comfortable as possible.  2. Bilateral pleural effusions, likely secondary to CHF exacerbation and transudative. Improving with diuresis. No need for thoracentesis at this time per PCCM.  3. CAD--Stable. Continue ASA. Holding statin and fibrate due to reported allergy to formulary meds and recent AKI.  4. Hypokalemia: Likely due to diuretic. Replete.  5. Acute kidney injury, resolved. 6. LUE DVT--continue heparin and warfarin per pharmacy. Monitor for signs of bleeding given recent GIB and biopsy  7. Abdominal discomfort--appears resolved s/p paracentesis.  8. Left leg ulcer--Wound care. 9. Diabetes--SSI low-coverage  10. Anemia, stable from prior. Likely not contributing to dyspnea  11. GERD--Continue PPI.  Code Status: DNR/DNI. Do not reapply bipap. Continue diuresis, but if decompensates, make comfort care only.  Family Communication: discussed with daughter at bedside Disposition Plan: Pending stable respiratory status after adequate diuresis, plan for anticoagulation outpatient.  [name], MD  [facility] Team 8 Pager [phone]. If 8PM-8AM, please contact night-coverage at [email or web address], password [facility] 03/17/2012, 5:27 PM  LOS: 5 days   Brief narrative:  85yom with history of CAD s/p MI. He has been becoming progressively more short of breath. He had a CXR in the morning ordered by the nurse practitioner at the [facility] nursing home where he stays and it showed pulmonary edema and pleural effusions. He was transported to the [facility] for further evalaution. He has had a  nonproductive cough, worsening swelling of the left arm, and a new fluid blister of the left leg which burst recently. He feels like drowning when he lies back. Dyspnea without exertion.  Per history, he has had a possible DVT in the left arm previously which was treated with coumadin. He has not been on coumadin for several weeks due to his GI bleed and because he recently underwent EGD with biopsy on 02/27/12.   Consultants:  Cardiology  Critical care Procedures:  Abd [place] 8/18  PICC line 8/18  HPI/Subjective: Feels better, gets anxious at night.  Objective: Filed Vitals:   03/16/12 2112 03/17/12 0416 03/17/12 1053 03/17/12 1430  BP: 123/53 116/53 115/64 150/62  Pulse: 81 73 71 72  Temp: 98.6 F (37 C) 97.7 F (36.5 C) 97.4 F (36.3 C) 98.1 F (36.7 C)  TempSrc: Oral Oral Oral Oral  Resp: 18 18 20 20   Height:      Weight:  79.8 kg (175 lb 14.8 oz)    SpO2: 97% 100% 99% 100%    Intake/Output Summary (Last 24 hours) at 03/17/12 1727 Last data filed at 03/17/12 1356  Gross per 24 hour  Intake    310 ml  Output   2550 ml  Net  -2240 ml    Exam:   General:  Appears calm and comfortable.  Cardiovascular: RRR, no m/r/g. 2+ LE edema left LLE, right AKA.  Respiratory: CTA bilaterally, no w/r/r. Normal respiratory effort.  Abdomen: soft, ntnd  Data Reviewed: Basic Metabolic Panel:  Lab 03/17/12 8295 03/16/12 1629 03/16/12 0515 03/15/12 1710 03/15/12 0355 03/14/12 1415  NA 137 135 135 137 140 --  K 3.6 2.9* 3.4* 3.2* 3.3* --  CL 86* 88* 87* 92* 93* --  CO2 >45* 44* >45* 42* 42* --  GLUCOSE 122* 199* 129* 120* 105* --  BUN 19 18 19 18 20  --  CREATININE 0.74 0.75 0.85 0.73 0.79 --  CALCIUM 9.5 8.5 9.3 8.4 9.4 --  MG -- 1.6 1.7 1.6 1.9 1.8  PHOS -- -- -- -- -- 4.2   Liver Function Tests:  Lab 03/14/12 1415 03/13/12 0456 03/12/12 1553  AST 28 30 34  ALT 9 9 11   ALKPHOS 42 41 47  BILITOT 0.7 0.9 0.9  PROT 6.5 6.4 7.3  ALBUMIN 2.5* 2.5* 2.9*   CBC:  Lab  03/17/12 0800 03/16/12 0515 03/15/12 0355 03/14/12 2230 03/14/12 1415  WBC 6.4 5.9 6.0 6.0 5.5  NEUTROABS -- -- -- -- 4.4  HGB 9.5* 9.0* 8.9* 8.8* 7.6*  HCT 30.7* 29.3* 29.7* 28.9* 26.3*  MCV 74.3* 75.9* 76.3* 76.9* 79.0  PLT 217 207 209 195 192   Cardiac Enzymes:  Lab 03/14/12 1415 03/12/12 1552  CKTOTAL -- --  CKMB -- --  CKMBINDEX -- --  TROPONINI <0.30 <0.30    Basename 03/12/12 1552 12/04/11 1053  PROBNP 23086.0* 6095.0*   CBG:  Lab 03/17/12 1713 03/17/12 1152 03/17/12 0755 03/16/12 2243 03/16/12 1647  GLUCAP 117* 162* 115* 144* 196*    Recent Results (from the past 240 hour(s))  MRSA PCR SCREENING     Status: Normal   Collection Time   03/12/12 11:12 PM      Component Value Range Status Comment   MRSA by PCR NEGATIVE  NEGATIVE Final      Studies: No results found.  Scheduled Meds:    . antiseptic oral rinse  15 mL Mouth Rinse BID  . aspirin EC  81 mg Oral Daily  . docusate sodium  100 mg Oral BID  . dutasteride  0.5 mg Oral Daily  . feeding supplement  237 mL Oral TID BM  . FLUoxetine  30 mg Oral Daily  . fluticasone  2 spray Each Nare Daily  . furosemide  120 mg Intravenous Q6H  . insulin aspart  0-5 Units Subcutaneous QHS  . insulin aspart  0-9 Units Subcutaneous TID WC  . iron polysaccharides  150 mg Oral Daily  . lidocaine  1 patch Transdermal Daily  . loratadine  5 mg Oral Daily  . magnesium sulfate 1 - 4 g bolus IVPB  2 g Intravenous Once  . multivitamin with minerals  1 tablet Oral Daily  . nitroGLYCERIN  1 inch Topical Q6H  . olopatadine  1 drop Both Eyes BID  . pantoprazole  40 mg Oral BID AC  . polyethylene glycol  17 g Oral Daily  . potassium chloride  20 mEq Oral Once  . potassium chloride  40 mEq Oral BID  . senna-docusate  1 tablet Oral QHS  . sodium chloride  10-40 mL Intracatheter Q12H  . spironolactone  25 mg Oral BID  . warfarin  2 mg Oral ONCE-1800  . warfarin  2.5 mg Oral ONCE-1800  . warfarin   Does not apply Once  .  Warfarin - Pharmacist Dosing Inpatient   Does not apply q1800  . DISCONTD: feeding supplement  237 mL Oral BID BM   Continuous Infusions:    . sodium chloride 20 mL/hr (03/14/12 0522)  . sodium chloride 20 mL/hr (03/14/12 0527)  . heparin 1,550 Units/hr (03/17/12 1231)  . milrinone 0.125 mcg/kg/min (03/17/12 0107)    Principal Problem:  *Acute exacerbation of congestive heart failure Active Problems:  DIABETES MELLITUS, TYPE II  ANXIETY  HYPERTENSION  PERIPHERAL  VASCULAR DISEASE  AV block, 2nd degree  Weakness  Anemia  Pleural effusion  Respiratory failure with hypercapnia  Acute kidney injury  Infected decubitus ulcer  Protein malnutrition     [name], MD  [facility] Team 8 Pager [phone]. If 8PM-8AM, please contact night-coverage at [email or web address], password [facility] 03/17/2012, 5:27 PM  LOS: 5 days   Time spent: 20 minutes

## 2012-03-17 NOTE — Progress Notes (Addendum)
Received call back from MD related to runs of vtach.  See new orders.  Pt sleeping, no pain noted. No apparent distress at this time.

## 2012-03-18 DIAGNOSIS — I82409 Acute embolism and thrombosis of unspecified deep veins of unspecified lower extremity: Secondary | ICD-10-CM

## 2012-03-18 LAB — GLUCOSE, CAPILLARY
Glucose-Capillary: 201 mg/dL — ABNORMAL HIGH (ref 70–99)
Glucose-Capillary: 153 mg/dL — ABNORMAL HIGH (ref 70–99)

## 2012-03-18 LAB — PROTIME-INR
INR: 1.79 — ABNORMAL HIGH (ref 0.00–1.49)
Prothrombin Time: 21.1 seconds — ABNORMAL HIGH (ref 11.6–15.2)

## 2012-03-18 LAB — CBC
MCH: 23.5 pg — ABNORMAL LOW (ref 26.0–34.0)
MCV: 73.5 fL — ABNORMAL LOW (ref 78.0–100.0)
Platelets: 183 10*3/uL (ref 150–400)
RDW: 17.9 % — ABNORMAL HIGH (ref 11.5–15.5)

## 2012-03-18 LAB — BASIC METABOLIC PANEL
Calcium: 9.5 mg/dL (ref 8.4–10.5)
Chloride: 85 mEq/L — ABNORMAL LOW (ref 96–112)
Creatinine, Ser: 0.78 mg/dL (ref 0.50–1.35)
GFR calc Af Amer: >90 mL/min (ref >90)
Sodium: 136 mEq/L (ref 135–145)

## 2012-03-18 MED ORDER — WARFARIN SODIUM 2 MG PO TABS
2.0000 mg | ORAL_TABLET | Freq: Once | ORAL | Status: AC
  Administered 2012-03-18: 2 mg via ORAL
  Filled 2012-03-18: qty 1

## 2012-03-18 MED ORDER — TAMSULOSIN HCL 0.4 MG PO CAPS
0.4000 mg | ORAL_CAPSULE | Freq: Every day | ORAL | Status: DC
  Administered 2012-03-18 – 2012-03-24 (×7): 0.4 mg via ORAL
  Filled 2012-03-18 (×7): qty 1

## 2012-03-18 MED ORDER — POTASSIUM CHLORIDE CRYS ER 20 MEQ PO TBCR
40.0000 meq | EXTENDED_RELEASE_TABLET | Freq: Every day | ORAL | Status: DC
  Administered 2012-03-18 – 2012-03-20 (×3): 40 meq via ORAL
  Filled 2012-03-18 (×3): qty 2

## 2012-03-18 NOTE — Progress Notes (Signed)
[facility] PROGRESS NOTE  Ivan [name] ZOX:[id number] DOB: 03/04/1927 DOA: 03/12/2012 PCP: Oliver Barre, MD  Assessment/Plan: 1. Acute systolic CHF--continues to improve. Continue Lasix and milrinone per cardiology. Per discussion with family and patient, will not reapply bipap. If increasing CO2 retention, will make patient has comfortable as possible.  2. Bilateral pleural effusions, likely secondary to CHF exacerbation and transudative. Improving with diuresis. No need for thoracentesis at this time per PCCM.  3. CAD--Stable. Continue ASA. Holding statin and fibrate due to reported allergy to formulary meds and recent AKI.  4. Hypokalemia: Likely due to diuretic. Replete.  5. Acute kidney injury, resolved. 6. LUE DVT--continue heparin and warfarin per pharmacy. Monitor for signs of bleeding given recent GIB and biopsy  7. Abdominal discomfort--appears resolved s/p paracentesis.  8. Left leg ulcer--Wound care. 9. Diabetes--SSI low-coverage  10. Anemia, stable from prior. Likely not contributing to dyspnea  11. GERD--Continue PPI.  Code Status: DNR/DNI. Do not reapply bipap. Continue diuresis, but if decompensates, make comfort care only.  Family Communication: discussed with daughter at bedside 8/23 Disposition Plan: Pending stable respiratory status after adequate diuresis, plan for anticoagulation outpatient.  [name], MD  [facility] Team 8 Pager [phone]. If 8PM-8AM, please contact night-coverage at [email or web address], password [facility] 03/18/2012, 10:08 AM  LOS: 6 days   Brief narrative:  85yom with history of CAD s/p MI. He has been becoming progressively more short of breath. He had a CXR in the morning ordered by the nurse practitioner at the [facility] nursing home where he stays and it showed pulmonary edema and pleural effusions. He was transported to the [facility] for further evalaution. He has had a nonproductive cough, worsening swelling of the left  arm, and a new fluid blister of the left leg which burst recently. He feels like drowning when he lies back. Dyspnea without exertion.  Per history, he has had a possible DVT in the left arm previously which was treated with coumadin. He has not been on coumadin for several weeks due to his GI bleed and because he recently underwent EGD with biopsy on 02/27/12.   Consultants:  Cardiology  Critical care Procedures:  Abd [place] 8/18  PICC line 8/18  HPI/Subjective: Feels better, slept well.  Objective: Filed Vitals:   03/17/12 1430 03/17/12 2127 03/18/12 0146 03/18/12 0703  BP: 150/62 135/42 105/66 128/43  Pulse: 72 80 77 80  Temp: 98.1 F (36.7 C) 97.6 F (36.4 C) 98.4 F (36.9 C) 97.8 F (36.6 C)  TempSrc: Oral Oral Oral Oral  Resp: 20 18 18 18   Height:      Weight:    70.7 kg (155 lb 13.8 oz)  SpO2: 100% 98% 99% 97%    Intake/Output Summary (Last 24 hours) at 03/18/12 1008 Last data filed at 03/18/12 8295  Gross per 24 hour  Intake 2124.64 ml  Output   3950 ml  Net -1825.36 ml    Exam:   General:  Appears calm and comfortable.  Cardiovascular: RRR, no m/r/g. trace LE edema left LLE, right AKA.  Respiratory: CTA bilaterally, no w/r/r. Normal respiratory effort.  Abdomen: soft, ntnd  Data Reviewed: Basic Metabolic Panel:  Lab 03/18/12 6213 03/17/12 0900 03/16/12 1629 03/16/12 0515 03/15/12 1710 03/15/12 0355 03/14/12 1415  NA 136 137 135 135 137 -- --  K 3.5 3.6 2.9* 3.4* 3.2* -- --  CL 85* 86* 88* 87* 92* -- --  CO2 >45* >45* 44* >45* 42* -- --  GLUCOSE 114* 122*  199* 129* 120* -- --  BUN 21 19 18 19 18  -- --  CREATININE 0.78 0.74 0.75 0.85 0.73 -- --  CALCIUM 9.5 9.5 8.5 9.3 8.4 -- --  MG -- -- 1.6 1.7 1.6 1.9 1.8  PHOS -- -- -- -- -- -- 4.2   Liver Function Tests:  Lab 03/14/12 1415 03/13/12 0456 03/12/12 1553  AST 28 30 34  ALT 9 9 11   ALKPHOS 42 41 47  BILITOT 0.7 0.9 0.9  PROT 6.5 6.4 7.3  ALBUMIN 2.5* 2.5* 2.9*   CBC:  Lab 03/17/12 0800  03/16/12 0515 03/15/12 0355 03/14/12 2230 03/14/12 1415  WBC 6.4 5.9 6.0 6.0 5.5  NEUTROABS -- -- -- -- 4.4  HGB 9.5* 9.0* 8.9* 8.8* 7.6*  HCT 30.7* 29.3* 29.7* 28.9* 26.3*  MCV 74.3* 75.9* 76.3* 76.9* 79.0  PLT 217 207 209 195 192   Cardiac Enzymes:  Lab 03/14/12 1415 03/12/12 1552  CKTOTAL -- --  CKMB -- --  CKMBINDEX -- --  TROPONINI <0.30 <0.30    Basename 03/12/12 1552 12/04/11 1053  PROBNP 23086.0* 6095.0*   CBG:  Lab 03/18/12 0627 03/17/12 2123 03/17/12 1713 03/17/12 1152 03/17/12 0755  GLUCAP 122* 149* 117* 162* 115*    Recent Results (from the past 240 hour(s))  MRSA PCR SCREENING     Status: Normal   Collection Time   03/12/12 11:12 PM      Component Value Range Status Comment   MRSA by PCR NEGATIVE  NEGATIVE Final      Studies: No results found.  Scheduled Meds:    . antiseptic oral rinse  15 mL Mouth Rinse BID  . aspirin EC  81 mg Oral Daily  . docusate sodium  100 mg Oral BID  . dutasteride  0.5 mg Oral Daily  . feeding supplement  237 mL Oral TID BM  . FLUoxetine  30 mg Oral Daily  . fluticasone  2 spray Each Nare Daily  . furosemide  120 mg Intravenous Q6H  . insulin aspart  0-5 Units Subcutaneous QHS  . insulin aspart  0-9 Units Subcutaneous TID WC  . iron polysaccharides  150 mg Oral Daily  . lidocaine  1 patch Transdermal Daily  . loratadine  5 mg Oral Daily  . multivitamin with minerals  1 tablet Oral Daily  . nitroGLYCERIN  1 inch Topical Q6H  . olopatadine  1 drop Both Eyes BID  . pantoprazole  40 mg Oral BID AC  . polyethylene glycol  17 g Oral Daily  . potassium chloride  40 mEq Oral BID  . senna-docusate  1 tablet Oral QHS  . sodium chloride  10-40 mL Intracatheter Q12H  . spironolactone  25 mg Oral BID  . warfarin  2.5 mg Oral ONCE-1800  . warfarin   Does not apply Once  . Warfarin - Pharmacist Dosing Inpatient   Does not apply q1800  . DISCONTD: feeding supplement  237 mL Oral BID BM   Continuous Infusions:    . sodium  chloride 20 mL/hr at 03/17/12 0841  . sodium chloride 20 mL/hr at 03/17/12 0841  . heparin 1,550 Units/hr (03/18/12 0507)  . milrinone 0.124 mcg/kg/min (03/17/12 0841)    Principal Problem:  *Acute exacerbation of congestive heart failure Active Problems:  DIABETES MELLITUS, TYPE II  ANXIETY  HYPERTENSION  PERIPHERAL VASCULAR DISEASE  AV block, 2nd degree  Weakness  Anemia  Pleural effusion  Respiratory failure with hypercapnia  Acute kidney injury  Infected decubitus ulcer  Protein malnutrition     [name], MD  [facility] Team 8 Pager [phone]. If 8PM-8AM, please contact night-coverage at [email or web address], password [facility] 03/18/2012, 10:08 AM  LOS: 6 days   Time spent: 15 minutes

## 2012-03-18 NOTE — Progress Notes (Signed)
CSW is continuing to follow and will assist with all d/c needs when patient is medically stable.  Sabino Niemann, MSW, [employer] [phone]

## 2012-03-18 NOTE — Progress Notes (Signed)
ANTICOAGULATION CONSULT NOTE - Follow Up Consult  Pharmacy Consult for Heaprin/coumadin Indication: LUE DVT  Allergies  Allergen Reactions  . Sulfa Antibiotics Rash  . Atorvastatin   . Penicillins   . Simvastatin     Patient Measurements: Height: 5\' 8"  (172.7 cm) Weight: 155 lb 13.8 oz (70.7 kg) (bedscale) IBW/kg (Calculated) : 68.4   Vital Signs: Temp: 97.8 F (36.6 C) (08/23 0703) Temp src: Oral (08/23 0703) BP: 128/43 mmHg (08/23 0703) Pulse Rate: 80  (08/23 0703)  Labs:  Alvira Philips 03/18/12 1610 03/17/12 0900 03/17/12 0852 03/17/12 0800 03/16/12 1629 03/16/12 0515  HGB -- -- -- 9.5* -- 9.0*  HCT -- -- -- 30.7* -- 29.3*  PLT -- -- -- 217 -- 207  APTT -- -- -- -- -- --  LABPROT 21.1* -- 20.6* -- -- 26.1*  INR 1.79* -- 1.73* -- -- 2.35*  HEPARINUNFRC 0.12* -- 0.33 -- -- 0.62  CREATININE 0.78 0.74 -- -- 0.75 --  CKTOTAL -- -- -- -- -- --  CKMB -- -- -- -- -- --  TROPONINI -- -- -- -- -- --    Estimated Creatinine Clearance: 67.7 ml/min (by C-G formula based on Cr of 0.78).   Assessment: 85yom [facility] resident admitted 03/12/2012 for increased SOB.  Found to have LUE VTE.  Heparin now sub therapeutic, infusing without problems thru PICC, INR now 1.79 with history of warfarin sensitivity.  Given rise in INR  And recent GIB will not increase dose. H/H holding stable yesterday, but not drawn today. D5/5 overlap  Goal of Therapy:  Heparin level = 0.3-0.7 Monitor platelets by anticoagulation protocol: Yes INR = 2-3   Plan:  Increase heparin to 1600 units/hr Recheck heparin level and CBC in 6h. Coumadin 2 mg PO x1 Daily CBC, HL, INR  Thank you for allowing pharmacy to be a part of this patients care team.  Lovenia Kim Pharm.D., BCPS Clinical Pharmacist 03/18/2012 10:34 AM Pager: [phone] Phone: [phone]

## 2012-03-18 NOTE — Progress Notes (Signed)
Pt. Alarmed 6 beats Vtach.  Pt. Asymptomatic and asleep in bed.  VSS. Will continue to monitor.

## 2012-03-18 NOTE — Progress Notes (Signed)
ANTICOAGULATION CONSULT NOTE - Follow Up Consult  Pharmacy Consult for Heaprin/coumadin Indication: LUE DVT  Allergies  Allergen Reactions  . Sulfa Antibiotics Rash  . Atorvastatin   . Penicillins   . Simvastatin     Patient Measurements: Height: 5\' 8"  (172.7 cm) Weight: 155 lb 13.8 oz (70.7 kg) (bedscale) IBW/kg (Calculated) : 68.4   Vital Signs: Temp: 98.1 F (36.7 C) (08/23 1505) Temp src: Oral (08/23 1505) BP: 125/70 mmHg (08/23 1505) Pulse Rate: 82  (08/23 1505)  Labs:  Basename 03/18/12 1725 03/18/12 0635 03/17/12 0900 03/17/12 0852 03/17/12 0800 03/16/12 1629 03/16/12 0515  HGB 9.7* -- -- -- 9.5* -- --  HCT 30.3* -- -- -- 30.7* -- 29.3*  PLT 183 -- -- -- 217 -- 207  APTT -- -- -- -- -- -- --  LABPROT -- 21.1* -- 20.6* -- -- 26.1*  INR -- 1.79* -- 1.73* -- -- 2.35*  HEPARINUNFRC 0.20* 0.12* -- 0.33 -- -- --  CREATININE -- 0.78 0.74 -- -- 0.75 --  CKTOTAL -- -- -- -- -- -- --  CKMB -- -- -- -- -- -- --  TROPONINI -- -- -- -- -- -- --    Estimated Creatinine Clearance: 67.7 ml/min (by C-G formula based on Cr of 0.78).   Assessment: 85yom [facility] resident admitted 03/12/2012 for increased SOB.  Found to have LUE VTE.  Heparin drip 1600 uts/hr HL 0.2  sub therapeutic, infusing without problems thru PICC.   H/H holding stable yesterday, but not drawn today. No bleeding noted.  Goal of Therapy:  Heparin level = 0.3-0.7 Monitor platelets by anticoagulation protocol: Yes INR = 2-3   Plan:  Increase heparin to 1650 units/hr Daily CBC, HL, INR  Leota Sauers Pharm.D. CPP, BCPS Clinical Pharmacist [phone] 03/18/2012 7:05 PM

## 2012-03-18 NOTE — Progress Notes (Addendum)
Patient Name: YURI FANA Date of Encounter: 03/18/2012    SUBJECTIVE:About the same as on prior days.  TELEMETRY:  Paced rhythm: Filed Vitals:   03/17/12 1430 03/17/12 2127 03/18/12 0146 03/18/12 0703  BP: 150/62 135/42 105/66 128/43  Pulse: 72 80 77 80  Temp: 98.1 F (36.7 C) 97.6 F (36.4 C) 98.4 F (36.9 C) 97.8 F (36.6 C)  TempSrc: Oral Oral Oral Oral  Resp: 20 18 18 18   Height:      Weight:    70.7 kg (155 lb 13.8 oz)  SpO2: 100% 98% 99% 97%    Intake/Output Summary (Last 24 hours) at 03/18/12 1206 Last data filed at 03/18/12 5409  Gross per 24 hour  Intake 2124.64 ml  Output   3950 ml  Net -1825.36 ml   NET I/O since admission: 10.2 liters LABS: Basic Metabolic Panel:  Basename 03/18/12 0635 03/17/12 0900 03/16/12 1629 03/16/12 0515  NA 136 137 -- --  K 3.5 3.6 -- --  CL 85* 86* -- --  CO2 >45* >45* -- --  GLUCOSE 114* 122* -- --  BUN 21 19 -- --  CREATININE 0.78 0.74 -- --  CALCIUM 9.5 9.5 -- --  MG -- -- 1.6 1.7  PHOS -- -- -- --   CBC:  Basename 03/17/12 0800 03/16/12 0515  WBC 6.4 5.9  NEUTROABS -- --  HGB 9.5* 9.0*  HCT 30.7* 29.3*  MCV 74.3* 75.9*  PLT 217 207    Physical Exam: Blood pressure 128/43, pulse 80, temperature 97.8 F (36.6 C), temperature source Oral, resp. rate 18, height 5\' 8"  (1.727 m), weight 70.7 kg (155 lb 13.8 oz), SpO2 97.00%. Weight change:    Awake  Chest clear.   Edema improved  ASSESSMENT: 1. Acute on chronic combined systolic and diastolic heart failure, improving.  Plan:  1. Watch renal function closely and resume Demadex orally when renal function starts to bump.  [name] 03/18/2012, 12:06 PM

## 2012-03-18 NOTE — Progress Notes (Signed)
Occupational Therapy Note  OT order received and appreciated.  Pt was total assist with ADLs and transfers at [facility] per OT note on 03/14/12.  OT eval not need for SNF admission.  Will defer eval to next venue of care. Thank you.  03/18/2012 Cipriano Mile OTR/L Pager [phone] Office [phone]

## 2012-03-18 NOTE — Evaluation (Signed)
Physical Therapy Evaluation Patient Details Name: Ivan [name] MRN: [id number] DOB: August 14, 1926 Today's Date: 03/18/2012 Time: 4098-1191 PT Time Calculation (min): 38 min  PT Assessment / Plan / Recommendation Clinical Impression  Pt admitted with CHF exacerbation. Pt has been bed/WC bound for >3 years since fall in 2009. Spoke with RN and charge nurse regardiing need for pt to be transferred daily to the chair with a lift as well as continuous pressure relief techniques to prevent pressure ulcers. All other PT needs can be addressed in SNF, no further acute PT needs. Will not follow, please reorder if necessary.    PT Assessment  All further PT needs can be met in the next venue of care    Follow Up Recommendations  Skilled nursing facility;Supervision/Assistance - 24 hour       Equipment Recommendations  None recommended by PT          Precautions / Restrictions Precautions Precautions: Fall Restrictions Weight Bearing Restrictions: No         Mobility  Bed Mobility Bed Mobility: Rolling Right;Rolling Left Rolling Right: 1: +2 Total assist Rolling Right: Patient Percentage: 40% Rolling Left: 1: +2 Total assist Rolling Left: Patient Percentage: 20% Scooting to HOB: 1: +2 Total assist Scooting to [facility]: Patient Percentage: 10% Details for Bed Mobility Assistance: Completed rolls multiple times to complete hygiene and pressure relief. Pt with increased difficulty rolling to the right as his left shoulder is extremely impaired. Painful with all sidelying motions Transfers Transfers: Not assessed Ambulation/Gait Ambulation/Gait Assistance: Not tested (comment)    Exercises     PT Diagnosis: Generalized weakness  PT Problem List: Decreased strength;Decreased activity tolerance;Decreased balance;Decreased mobility;Decreased skin integrity;Pain;Decreased range of motion PT Treatment Interventions:      Visit Information  Last PT Received On: 03/18/12 Assistance Needed:  +2    Subjective Data  Patient Stated Goal: to go back to SNF   Prior Functioning  Home Living Lives With: Other (Comment) (long term SNF) Available Help at Discharge: Skilled Nursing Facility;Available 24 hours/day Type of Home: Skilled Nursing Facility Additional Comments: pt has been non-ambulatory for > 3 years. He is transferred daily to a wheelchair with a Michiel Sites and is dependent with all mobility. Prior Function Level of Independence: Needs assistance Comments: see above Communication Communication: HOH    Cognition  Overall Cognitive Status: Appears within functional limits for tasks assessed/performed Arousal/Alertness: Awake/alert Orientation Level: Appears intact for tasks assessed Behavior During Session: [employer] for tasks performed    Extremity/Trunk Assessment Right Lower Extremity Assessment RLE ROM/Strength/Tone: Deficits RLE ROM/Strength/Tone Deficits: right AKA. Pt able to complete SLR with no assistance Left Lower Extremity Assessment LLE ROM/Strength/Tone: Deficits LLE ROM/Strength/Tone Deficits: grossly 3-3+/5. Pt able to complete partial SLR. Assist with all other motions. Painful knee with calcium deposits.   Balance    End of Session PT - End of Session Equipment Utilized During Treatment: Oxygen Activity Tolerance: Patient limited by pain Patient left: in bed;with call bell/phone within reach;with family/visitor present;with nursing in room Nurse Communication: Mobility status    Milana Kidney 03/18/2012, 6:53 PM  03/18/2012 Milana Kidney DPT PAGER: [phone] OFFICE: [phone]

## 2012-03-19 LAB — PROTIME-INR
INR: 1.96 — ABNORMAL HIGH (ref 0.00–1.49)
Prothrombin Time: 22.7 seconds — ABNORMAL HIGH (ref 11.6–15.2)

## 2012-03-19 LAB — GLUCOSE, CAPILLARY
Glucose-Capillary: 132 mg/dL — ABNORMAL HIGH (ref 70–99)
Glucose-Capillary: 213 mg/dL — ABNORMAL HIGH (ref 70–99)
Glucose-Capillary: 131 mg/dL — ABNORMAL HIGH (ref 70–99)
Glucose-Capillary: 153 mg/dL — ABNORMAL HIGH (ref 70–99)

## 2012-03-19 LAB — HEPARIN LEVEL (UNFRACTIONATED)
Heparin Unfractionated: <0.1 IU/mL — ABNORMAL LOW (ref 0.30–0.70)
Heparin Unfractionated: 0.15 IU/mL — ABNORMAL LOW (ref 0.30–0.70)

## 2012-03-19 LAB — BASIC METABOLIC PANEL
Calcium: 9.7 mg/dL (ref 8.4–10.5)
GFR calc Af Amer: >90 mL/min (ref >90)
GFR calc non Af Amer: 80 mL/min — ABNORMAL LOW (ref >90)
Glucose, Bld: 124 mg/dL — ABNORMAL HIGH (ref 70–99)
Potassium: 3.4 mEq/L — ABNORMAL LOW (ref 3.5–5.1)
Sodium: 132 mEq/L — ABNORMAL LOW (ref 135–145)

## 2012-03-19 LAB — CBC
MCH: 23 pg — ABNORMAL LOW (ref 26.0–34.0)
MCHC: 31.5 g/dL (ref 30.0–36.0)
Platelets: 199 10*3/uL (ref 150–400)
RDW: 17.9 % — ABNORMAL HIGH (ref 11.5–15.5)

## 2012-03-19 MED ORDER — WARFARIN SODIUM 2 MG PO TABS
2.0000 mg | ORAL_TABLET | Freq: Once | ORAL | Status: AC
  Administered 2012-03-19: 2 mg via ORAL
  Filled 2012-03-19: qty 1

## 2012-03-19 MED ORDER — MILRINONE IN DEXTROSE 200-5 MCG/ML-% IV SOLN
0.1250 ug/kg/min | INTRAVENOUS | Status: DC
Start: 2012-03-19 — End: 2012-03-21
  Administered 2012-03-19 – 2012-03-21 (×2): 0.125 ug/kg/min via INTRAVENOUS
  Filled 2012-03-19 (×3): qty 100

## 2012-03-19 NOTE — Progress Notes (Signed)
Pharmacy: Re- Heparin  A:  Patient is an 78 y.o M with recent GIB and now on heparin for DVT.  Heparin level remains subtherapeutic at 0.15 despite rate increased to 1750 units/hr earlier today.  No issues with IV line.  P: 1) Increase heparin rate to 1850 units/hr      2) check 8 hour heparin level  Dorna Leitz, PharmD, BCPS

## 2012-03-19 NOTE — Progress Notes (Signed)
[facility] PROGRESS NOTE  Ivan [name] YQM:[id number] DOB: Sep 02, 1926 DOA: 03/12/2012 PCP: Oliver Barre, MD  Assessment/Plan: 1. Acute systolic CHF--continues to improve. Continue Lasix and milrinone per cardiology. Per discussion with family and patient, will not reapply bipap. If increasing CO2 retention, will make patient has comfortable as possible.  2. Bilateral pleural effusions, likely secondary to CHF exacerbation and transudative. No need for thoracentesis at this time per PCCM.  3. CAD--Stable. Continue ASA. Holding statin and fibrate due to reported allergy to formulary meds and recent AKI.  4. Hypokalemia: Likely due to diuretic. Replete.  5. Acute kidney injury, resolved. 6. LUE DVT--continue heparin and warfarin per pharmacy. Monitor for signs of bleeding given recent GIB and biopsy  7. Abdominal discomfort--appears resolved s/p paracentesis.  8. Left leg ulcer--Wound care. 9. Diabetes--Stable. Continue SSI.  10. Anemia, stable from prior. 11. GERD--Continue PPI.  Code Status: DNR/DNI. Do not reapply bipap. Continue diuresis, but if decompensates, make comfort care only.  Family Communication: discussed with daughter at bedside 8/24 Disposition Plan: Pending stable respiratory status after adequate diuresis, plan for anticoagulation outpatient.  [name], MD  [facility] Team 8 Pager [phone]. If 8PM-8AM, please contact night-coverage at [email or web address], password [facility] 03/19/2012, 7:19 PM  LOS: 7 days   Brief narrative:  85yom with history of CAD s/p MI. He has been becoming progressively more short of breath. He had a CXR in the morning ordered by the nurse practitioner at the [facility] nursing home where he stays and it showed pulmonary edema and pleural effusions. He was transported to the [facility] for further evalaution. He has had a nonproductive cough, worsening swelling of the left arm, and a new fluid blister of the left leg which burst  recently. He feels like drowning when he lies back. Dyspnea without exertion.  Per history, he has had a possible DVT in the left arm previously which was treated with coumadin. He has not been on coumadin for several weeks due to his GI bleed and because he recently underwent EGD with biopsy on 02/27/12.   Consultants:  Cardiology  Critical care Procedures:  Abd [place] 8/18  PICC line 8/18  HPI/Subjective: Feels ok.  Objective: Filed Vitals:   03/18/12 1505 03/18/12 2110 03/19/12 0501 03/19/12 1350  BP: 125/70 102/55 127/69 125/64  Pulse: 82 81 82 71  Temp: 98.1 F (36.7 C) 98.2 F (36.8 C) 97.7 F (36.5 C) 98.6 F (37 C)  TempSrc: Oral Oral Oral Oral  Resp: 20 20 18 19   Height:      Weight:   68.7 kg (151 lb 7.3 oz)   SpO2: 99% 95% 95% 96%    Intake/Output Summary (Last 24 hours) at 03/19/12 1919 Last data filed at 03/19/12 1800  Gross per 24 hour  Intake 2106.43 ml  Output   2225 ml  Net -118.57 ml    Exam:   General:  Appears calm and comfortable.  Cardiovascular: RRR, no m/r/g. trace LE edema left LLE, right AKA.  Respiratory: CTA bilaterally, no w/r/r. Normal respiratory effort.  Abdomen: soft, nt, distended  Data Reviewed: Basic Metabolic Panel:  Lab 03/19/12 4401 03/18/12 0635 03/17/12 0900 03/16/12 1629 03/16/12 0515 03/15/12 1710 03/15/12 0355 03/14/12 1415  NA 132* 136 137 135 135 -- -- --  K 3.4* 3.5 3.6 2.9* 3.4* -- -- --  CL 84* 85* 86* 88* 87* -- -- --  CO2 44* >45* >45* 44* >45* -- -- --  GLUCOSE 124* 114* 122* 199* 129* -- -- --  BUN 25* 21 19 18 19  -- -- --  CREATININE 0.81 0.78 0.74 0.75 0.85 -- -- --  CALCIUM 9.7 9.5 9.5 8.5 9.3 -- -- --  MG -- -- -- 1.6 1.7 1.6 1.9 1.8  PHOS -- -- -- -- -- -- -- 4.2   Liver Function Tests:  Lab 03/14/12 1415 03/13/12 0456  AST 28 30  ALT 9 9  ALKPHOS 42 41  BILITOT 0.7 0.9  PROT 6.5 6.4  ALBUMIN 2.5* 2.5*   CBC:  Lab 03/19/12 1800 03/18/12 1725 03/17/12 0800 03/16/12 0515 03/15/12 0355  03/14/12 1415  WBC 6.8 6.7 6.4 5.9 6.0 --  NEUTROABS -- -- -- -- -- 4.4  HGB 9.8* 9.7* 9.5* 9.0* 8.9* --  HCT 31.1* 30.3* 30.7* 29.3* 29.7* --  MCV 72.8* 73.5* 74.3* 75.9* 76.3* --  PLT 199 183 217 207 209 --   Cardiac Enzymes:  Lab 03/14/12 1415  CKTOTAL --  CKMB --  CKMBINDEX --  TROPONINI <0.30    Basename 03/12/12 1552 12/04/11 1053  PROBNP 23086.0* 6095.0*   CBG:  Lab 03/19/12 1634 03/19/12 1105 03/19/12 0622 03/18/12 2111 03/18/12 1624  GLUCAP 131* 213* 132* 153* 157*    Recent Results (from the past 240 hour(s))  MRSA PCR SCREENING     Status: Normal   Collection Time   03/12/12 11:12 PM      Component Value Range Status Comment   MRSA by PCR NEGATIVE  NEGATIVE Final      Studies: No results found.  Scheduled Meds:    . antiseptic oral rinse  15 mL Mouth Rinse BID  . aspirin EC  81 mg Oral Daily  . docusate sodium  100 mg Oral BID  . dutasteride  0.5 mg Oral Daily  . feeding supplement  237 mL Oral TID BM  . FLUoxetine  30 mg Oral Daily  . fluticasone  2 spray Each Nare Daily  . furosemide  120 mg Intravenous Q6H  . insulin aspart  0-5 Units Subcutaneous QHS  . insulin aspart  0-9 Units Subcutaneous TID WC  . iron polysaccharides  150 mg Oral Daily  . lidocaine  1 patch Transdermal Daily  . loratadine  5 mg Oral Daily  . multivitamin with minerals  1 tablet Oral Daily  . nitroGLYCERIN  1 inch Topical Q6H  . olopatadine  1 drop Both Eyes BID  . pantoprazole  40 mg Oral BID AC  . polyethylene glycol  17 g Oral Daily  . potassium chloride  40 mEq Oral Daily  . senna-docusate  1 tablet Oral QHS  . sodium chloride  10-40 mL Intracatheter Q12H  . spironolactone  25 mg Oral BID  . Tamsulosin HCl  0.4 mg Oral Daily  . warfarin  2 mg Oral ONCE-1800  . warfarin   Does not apply Once  . Warfarin - Pharmacist Dosing Inpatient   Does not apply q1800   Continuous Infusions:    . sodium chloride 20 mL/hr at 03/17/12 0841  . sodium chloride 20 mL/hr at  03/17/12 0841  . heparin 1,850 Units/hr (03/19/12 1907)  . milrinone 0.125 mcg/kg/min (03/19/12 1122)  . DISCONTD: milrinone 0.125 mcg/kg/min (03/18/12 1150)    Principal Problem:  *Acute exacerbation of congestive heart failure Active Problems:  DIABETES MELLITUS, TYPE II  ANXIETY  HYPERTENSION  PERIPHERAL VASCULAR DISEASE  AV block, 2nd degree  Weakness  Anemia  Pleural effusion  Respiratory failure with hypercapnia  Acute kidney injury  Infected decubitus ulcer  Protein malnutrition  DVT (deep venous thrombosis)     [name], MD  [facility] Team 8 Pager [phone]. If 8PM-8AM, please contact night-coverage at [email or web address], password [facility] 03/19/2012, 7:19 PM  LOS: 7 days   Time spent: 15 minutes

## 2012-03-19 NOTE — Progress Notes (Signed)
SUBJECTIVE:  Says he thinks his breathing has improved  OBJECTIVE:   Vitals:   Filed Vitals:   03/18/12 0703 03/18/12 1505 03/18/12 2110 03/19/12 0501  BP: 128/43 125/70 102/55 127/69  Pulse: 80 82 81 82  Temp: 97.8 F (36.6 C) 98.1 F (36.7 C) 98.2 F (36.8 C) 97.7 F (36.5 C)  TempSrc: Oral Oral Oral Oral  Resp: 18 20 20 18   Height:      Weight: 70.7 kg (155 lb 13.8 oz)   68.7 kg (151 lb 7.3 oz)  SpO2: 97% 99% 95% 95%   I&O's:   Intake/Output Summary (Last 24 hours) at 03/19/12 0848 Last data filed at 03/19/12 4696  Gross per 24 hour  Intake 2050.77 ml  Output   2700 ml  Net -649.23 ml   TELEMETRY: Reviewed telemetry pt in NSR     PHYSICAL EXAM General: Well developed, well nourished, in no acute distress Head: Eyes PERRLA, No xanthomas.   Normal cephalic and atramatic  Lungs:   Decreased BS at bases Heart:   HRRR S1 S2 Pulses are 2+ & equal. Abdomen: Bowel sounds are positive, abdomen soft and non-tender without masses  Extremities:   No clubbing, cyanosis or edema.  DP +1 Neuro: Alert and oriented X 3. Psych:  Good affect, responds appropriately   LABS: Basic Metabolic Panel:  Basename 03/19/12 0710 03/18/12 0635 03/16/12 1629  NA 132* 136 --  K 3.4* 3.5 --  CL 84* 85* --  CO2 44* >45* --  GLUCOSE 124* 114* --  BUN 25* 21 --  CREATININE 0.81 0.78 --  CALCIUM 9.7 9.5 --  MG -- -- 1.6  PHOS -- -- --   Liver Function Tests: No results found for this basename: AST:2,ALT:2,ALKPHOS:2,BILITOT:2,PROT:2,ALBUMIN:2 in the last 72 hours No results found for this basename: LIPASE:2,AMYLASE:2 in the last 72 hours CBC:  Basename 03/18/12 1725 03/17/12 0800  WBC 6.7 6.4  NEUTROABS -- --  HGB 9.7* 9.5*  HCT 30.3* 30.7*  MCV 73.5* 74.3*  PLT 183 217   Coag Panel:   Lab Results  Component Value Date   INR 1.96* 03/19/2012   INR 1.79* 03/18/2012   INR 1.73* 03/17/2012    RADIOLOGY: US Abdomen Limited  03/13/2012  *RADIOLOGY REPORT*  Clinical Data: Evaluate  for ascites.  LIMITED ABDOMEN ULTRASOUND FOR ASCITES  Technique:  Limited ultrasound survey for ascites was performed in all four abdominal quadrants.  Comparison:  None.  Findings: Small to moderate volume abdominal and pelvic ascites. Incidental note is made of bilateral pleural effusions.  Liver not well evaluated.  IMPRESSION:  1.  Small to moderate volume abdominal pelvic ascites. 2.  Bilateral pleural effusions.  Original Report Authenticated By: Consuello Bossier, M.D.   Dg Chest Port 1 View  03/14/2012  *RADIOLOGY REPORT*  Clinical Data: Increasing shortness of breath and weakness  PORTABLE CHEST - 1 VIEW  Comparison: Portable chest x-ray of 03/06/2012  Findings: The lungs are poorly aerated.  There is cardiomegaly present with effusions and airspace disease most consistent with edema.  A Port-A-Cath and right-sided central venous line remain.  IMPRESSION: Diminished aeration with worsening of probable CHF with effusions.   Original Report Authenticated By: Juline Patch, M.D.    Dg Chest Port 1 View  03/13/2012  *RADIOLOGY REPORT*  Clinical Data: Line placement.  PORTABLE CHEST - 1 VIEW  Comparison: 1 day prior  Findings: A right-sided PICC line followed to the level of the high right atrium. Pacer with leads  at right atrium and right ventricle. No lead discontinuity.  No pneumothorax.  Moderate cardiomegaly.  Layering bilateral pleural effusions are not significantly changed.  Low lung volumes with slight improvement in mild interstitial edema.  Bibasilar airspace disease is similar.  IMPRESSION:  1.  Right-sided PICC line terminating over the high right atrium. If mid to low SVC position is desired, recommend retraction 5.8 cm. 2.  Slight improvement in mild congestive heart failure. 3.  Similar bilateral pleural effusions and bibasilar airspace disease, likely atelectasis.  Original Report Authenticated By: Consuello Bossier, M.D.   Dg Chest Portable 1 View  03/12/2012  *RADIOLOGY REPORT*  Clinical  Data: 78 year old male with shortness of breath, swelling.  PORTABLE CHEST - 1 VIEW  Comparison: 12/05/2011 and earlier.  Findings: Portable semi upright AP view at 1617 hours.  More lordotic view. Stable cardiomegaly and mediastinal contours. Stable left chest cardiac pacemaker.  Increase veiling opacity on the right.  No pneumothorax.  Stable vascular congestion, no overt pulmonary edema.  Continued bibasilar opacity.  IMPRESSION: Interval increased right pleural effusion, probably moderate. Stable vascular congestion and basilar atelectasis.  Original Report Authenticated By: Harley Hallmark, M.D.      ASSESSMENT:  1.  Acute on chronic combined systolic/diastolic CHF - improving.  Weight decreased 2 kg.  I<O with net -10L since admit 2.  Bilateral pleural effusions secondary to #1 3.  CAD stable 4.  Hypokalemia 5.  LUE DVT on Warfarin and Heparin 6.  DM  PLAN:   1.  Continue IV Lasix 2.  Replete potassium per Hospitalist  Quintella Reichert, MD  03/19/2012  8:48 AM

## 2012-03-19 NOTE — Progress Notes (Signed)
ANTICOAGULATION CONSULT NOTE - Follow Up Consult  Pharmacy Consult for Heaprin/coumadin Indication: LUE DVT  Allergies  Allergen Reactions  . Sulfa Antibiotics Rash  . Atorvastatin   . Penicillins   . Simvastatin     Patient Measurements: Height: 5\' 8"  (172.7 cm) Weight: 151 lb 7.3 oz (68.7 kg) (bedscale) IBW/kg (Calculated) : 68.4   Vital Signs: Temp: 97.7 F (36.5 C) (08/24 0501) Temp src: Oral (08/24 0501) BP: 127/69 mmHg (08/24 0501) Pulse Rate: 82  (08/24 0501)  Labs:  Basename 03/19/12 0710 03/18/12 1725 03/18/12 0635 03/17/12 0900 03/17/12 0852 03/17/12 0800  HGB -- 9.7* -- -- -- 9.5*  HCT -- 30.3* -- -- -- 30.7*  PLT -- 183 -- -- -- 217  APTT -- -- -- -- -- --  LABPROT 22.7* -- 21.1* -- 20.6* --  INR 1.96* -- 1.79* -- 1.73* --  HEPARINUNFRC <0.10* 0.20* 0.12* -- -- --  CREATININE 0.81 -- 0.78 0.74 -- --  CKTOTAL -- -- -- -- -- --  CKMB -- -- -- -- -- --  TROPONINI -- -- -- -- -- --    Estimated Creatinine Clearance: 66.9 ml/min (by C-G formula based on Cr of 0.81).   Assessment: 78 yo man admitted 03/12/2012 for increased SOB.  Found to have LUE VTE. Pharmacy consulted to  start coumadin for new LUE DVT. 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 He was taken off of coumadin due to a recent GIB. He has been off of coumadin for several weeks now.   Anticoagulation: DVT:  Heparin now sub therapeutic on 1650/hr, infusing without problems thru PICC, INR now 1.9  with history of warfarin sensitivity, 5 day minimum overlap complete.  Given rise in INR  And recent GIB will increase dose cautiously . H/H holding stable yesterday, but not drawn today. No bleeding. HL to be drawn form [facility] lab to collect.  Inc to 1750 check in 6h, no CBC will check that too.  Goal of Therapy:  Heparin level = 0.3-0.7 Monitor platelets by anticoagulation protocol: Yes INR = 2-3   Plan:  Increase heparin to 1750 units/hr Recheck heparin level and CBC in 6h. Coumadin 2 mg PO x1 Daily  CBC, HL, INR  Thank you for allowing pharmacy to be a part of this patients care team.  Lovenia Kim Pharm.D., BCPS Clinical Pharmacist 03/19/2012 11:12 AM Pager: [phone] Phone: [phone]

## 2012-03-20 LAB — BASIC METABOLIC PANEL
CO2: 40 mEq/L (ref 19–32)
Calcium: 9.4 mg/dL (ref 8.4–10.5)
Chloride: 84 mEq/L — ABNORMAL LOW (ref 96–112)
Creatinine, Ser: 0.84 mg/dL (ref 0.50–1.35)
Glucose, Bld: 118 mg/dL — ABNORMAL HIGH (ref 70–99)

## 2012-03-20 LAB — CBC
MCH: 22.9 pg — ABNORMAL LOW (ref 26.0–34.0)
MCHC: 31.4 g/dL (ref 30.0–36.0)
MCV: 73 fL — ABNORMAL LOW (ref 78.0–100.0)
Platelets: 225 10*3/uL (ref 150–400)

## 2012-03-20 LAB — GLUCOSE, CAPILLARY
Glucose-Capillary: 180 mg/dL — ABNORMAL HIGH (ref 70–99)
Glucose-Capillary: 134 mg/dL — ABNORMAL HIGH (ref 70–99)

## 2012-03-20 LAB — PROTIME-INR
INR: 2.16 — ABNORMAL HIGH (ref 0.00–1.49)
Prothrombin Time: 24.5 seconds — ABNORMAL HIGH (ref 11.6–15.2)

## 2012-03-20 MED ORDER — POTASSIUM CHLORIDE CRYS ER 20 MEQ PO TBCR
40.0000 meq | EXTENDED_RELEASE_TABLET | Freq: Three times a day (TID) | ORAL | Status: DC
  Administered 2012-03-20 (×2): 40 meq via ORAL
  Filled 2012-03-20 (×5): qty 2

## 2012-03-20 MED ORDER — WARFARIN SODIUM 2 MG PO TABS
2.0000 mg | ORAL_TABLET | Freq: Once | ORAL | Status: AC
  Administered 2012-03-20: 2 mg via ORAL
  Filled 2012-03-20: qty 1

## 2012-03-20 NOTE — Progress Notes (Signed)
[facility] PROGRESS NOTE  Ivan [name] ZOX:[id number] DOB: 1927/04/11 DOA: 03/12/2012 PCP: Oliver Barre, MD  Assessment/Plan: 1. Acute systolic CHF--slowly improving. Continue Lasix and milrinone per cardiology. Per discussion with family and patient, will not reapply bipap. If increasing CO2 retention, will make patient has comfortable as possible.  2. Bilateral pleural effusions, likely secondary to CHF exacerbation and transudative. No need for thoracentesis at this time per PCCM.  3. CAD--Stable. Continue ASA.  4. Hypokalemia: Likely due to diuretic. Replete. Check magnesium in AM.  5. Acute kidney injury, resolved. 6. LUE DVT--continue heparin and warfarin per pharmacy. Monitor for signs of bleeding given recent GIB and biopsy  7. Abdominal discomfort--appears resolved s/p paracentesis.  8. Left leg ulcer--Wound care. 9. Diabetes--Stable. Continue SSI.  10. Anemia, stable from prior. 11. GERD--Continue PPI.  Code Status: DNR/DNI. Do not reapply bipap. Continue diuresis, but if decompensates, make comfort care only.  Family Communication: discussed with daughter at bedside 8/25 Disposition Plan: Return to SNF, alternatively, consider LTAC  [name], MD  [facility] Team 8 Pager [phone]. If 8PM-8AM, please contact night-coverage at [email or web address], password [facility] 03/20/2012, 12:36 PM  LOS: 8 days   Brief narrative:  85yom with history of CAD s/p MI. He has been becoming progressively more short of breath. He had a CXR in the morning ordered by the nurse practitioner at the [facility] nursing home where he stays and it showed pulmonary edema and pleural effusions. He was transported to the [facility] for further evalaution. He has had a nonproductive cough, worsening swelling of the left arm, and a new fluid blister of the left leg which burst recently. He feels like drowning when he lies back. Dyspnea without exertion.  Per history, he has had a possible DVT  in the left arm previously which was treated with coumadin. He has not been on coumadin for several weeks due to his GI bleed and because he recently underwent EGD with biopsy on 02/27/12.   Consultants:  Cardiology  Critical care Procedures:  Abd [place] 8/18  PICC line 8/18  HPI/Subjective: Feels ok. No new issues.  Objective: Filed Vitals:   03/19/12 2100 03/20/12 0327 03/20/12 0512 03/20/12 0946  BP: 134/78 136/74 130/82 123/70  Pulse: 90 88 88 69  Temp: 97.9 F (36.6 C) 98 F (36.7 C) 97.8 F (36.6 C)   TempSrc: Oral Oral Oral   Resp: 20 20 20    Height:      Weight:   68.4 kg (150 lb 12.7 oz)   SpO2: 96% 96% 92%     Intake/Output Summary (Last 24 hours) at 03/20/12 1236 Last data filed at 03/20/12 1059  Gross per 24 hour  Intake 2041.99 ml  Output   2725 ml  Net -683.01 ml   Filed Weights   03/18/12 0703 03/19/12 0501 03/20/12 0512  Weight: 70.7 kg (155 lb 13.8 oz) 68.7 kg (151 lb 7.3 oz) 68.4 kg (150 lb 12.7 oz)     Exam:   General:  Appears calm and comfortable.  Cardiovascular: RRR, no m/r/g. No LE edema left LLE, right AKA.  Respiratory: CTA bilaterally, no w/r/r. Normal respiratory effort.  Abdomen: soft, nt, mildly distended  Data Reviewed: Basic Metabolic Panel:  Lab 03/20/12 8295 03/19/12 0710 03/18/12 0635 03/17/12 0900 03/16/12 1629 03/16/12 0515 03/15/12 1710 03/15/12 0355 03/14/12 1415  NA 133* 132* 136 137 135 -- -- -- --  K 3.2* 3.4* 3.5 3.6 2.9* -- -- -- --  CL 84* 84* 85*  86* 88* -- -- -- --  CO2 40* 44* >45* >45* 44* -- -- -- --  GLUCOSE 118* 124* 114* 122* 199* -- -- -- --  BUN 28* 25* 21 19 18  -- -- -- --  CREATININE 0.84 0.81 0.78 0.74 0.75 -- -- -- --  CALCIUM 9.4 9.7 9.5 9.5 8.5 -- -- -- --  MG -- -- -- -- 1.6 1.7 1.6 1.9 1.8  PHOS -- -- -- -- -- -- -- -- 4.2   Liver Function Tests:  Lab 03/14/12 1415  AST 28  ALT 9  ALKPHOS 42  BILITOT 0.7  PROT 6.5  ALBUMIN 2.5*   CBC:  Lab 03/20/12 0500 03/19/12 1800 03/18/12 1725  03/17/12 0800 03/16/12 0515 03/14/12 1415  WBC 7.5 6.8 6.7 6.4 5.9 --  NEUTROABS -- -- -- -- -- 4.4  HGB 9.6* 9.8* 9.7* 9.5* 9.0* --  HCT 30.6* 31.1* 30.3* 30.7* 29.3* --  MCV 73.0* 72.8* 73.5* 74.3* 75.9* --  PLT 225 199 183 217 207 --   Cardiac Enzymes:  Lab 03/14/12 1415  CKTOTAL --  CKMB --  CKMBINDEX --  TROPONINI <0.30    Basename 03/12/12 1552 12/04/11 1053  PROBNP 23086.0* 6095.0*   CBG:  Lab 03/20/12 1107 03/20/12 0653 03/19/12 2149 03/19/12 1634 03/19/12 1105  GLUCAP 180* 118* 153* 131* 213*    Recent Results (from the past 240 hour(s))  MRSA PCR SCREENING     Status: Normal   Collection Time   03/12/12 11:12 PM      Component Value Range Status Comment   MRSA by PCR NEGATIVE  NEGATIVE Final     Studies: No results found.  Scheduled Meds:    . antiseptic oral rinse  15 mL Mouth Rinse BID  . aspirin EC  81 mg Oral Daily  . docusate sodium  100 mg Oral BID  . dutasteride  0.5 mg Oral Daily  . feeding supplement  237 mL Oral TID BM  . FLUoxetine  30 mg Oral Daily  . fluticasone  2 spray Each Nare Daily  . furosemide  120 mg Intravenous Q6H  . insulin aspart  0-5 Units Subcutaneous QHS  . insulin aspart  0-9 Units Subcutaneous TID WC  . iron polysaccharides  150 mg Oral Daily  . lidocaine  1 patch Transdermal Daily  . loratadine  5 mg Oral Daily  . multivitamin with minerals  1 tablet Oral Daily  . nitroGLYCERIN  1 inch Topical Q6H  . olopatadine  1 drop Both Eyes BID  . pantoprazole  40 mg Oral BID AC  . polyethylene glycol  17 g Oral Daily  . potassium chloride  40 mEq Oral Daily  . senna-docusate  1 tablet Oral QHS  . sodium chloride  10-40 mL Intracatheter Q12H  . spironolactone  25 mg Oral BID  . Tamsulosin HCl  0.4 mg Oral Daily  . warfarin  2 mg Oral ONCE-1800  . warfarin  2 mg Oral ONCE-1800  . warfarin   Does not apply Once  . Warfarin - Pharmacist Dosing Inpatient   Does not apply q1800   Continuous Infusions:    . sodium chloride  20 mL/hr at 03/17/12 0841  . sodium chloride 20 mL/hr at 03/17/12 0841  . milrinone 0.125 mcg/kg/min (03/19/12 1122)  . DISCONTD: heparin 1,850 Units/hr (03/19/12 1907)    Principal Problem:  *Acute exacerbation of congestive heart failure Active Problems:  DIABETES MELLITUS, TYPE II  ANXIETY  HYPERTENSION  PERIPHERAL VASCULAR DISEASE  AV block, 2nd degree  Weakness  Anemia  Pleural effusion  Respiratory failure with hypercapnia  Acute kidney injury  Infected decubitus ulcer  Protein malnutrition  DVT (deep venous thrombosis)     [name], MD  [facility] Team 8 Pager [phone]. If 8PM-8AM, please contact night-coverage at [email or web address], password [facility] 03/20/2012, 12:36 PM  LOS: 8 days   Time spent: 15 minutes

## 2012-03-20 NOTE — Progress Notes (Signed)
ANTICOAGULATION CONSULT NOTE - Follow Up Consult  Pharmacy Consult for heparin / coumadin Indication: DVT  Allergies  Allergen Reactions  . Sulfa Antibiotics Rash  . Atorvastatin   . Penicillins   . Simvastatin     Patient Measurements: Height: 5\' 8"  (172.7 cm) Weight: 150 lb 12.7 oz (68.4 kg) (bed scale) IBW/kg (Calculated) : 68.4  Heparin Dosing Weight: 68.4 kg  Vital Signs: Temp: 97.8 F (36.6 C) (08/25 0512) Temp src: Oral (08/25 0512) BP: 130/82 mmHg (08/25 0512) Pulse Rate: 88  (08/25 0512)  Labs:  Basename 03/20/12 0500 03/19/12 1800 03/19/12 0710 03/18/12 1725 03/18/12 0635  HGB 9.6* 9.8* -- -- --  HCT 30.6* 31.1* -- 30.3* --  PLT 225 199 -- 183 --  APTT -- -- -- -- --  LABPROT 24.5* -- 22.7* -- 21.1*  INR 2.16* -- 1.96* -- 1.79*  HEPARINUNFRC 0.38 0.15* <0.10* -- --  CREATININE 0.84 -- 0.81 -- 0.78  CKTOTAL -- -- -- -- --  CKMB -- -- -- -- --  TROPONINI -- -- -- -- --    Estimated Creatinine Clearance: 64.5 ml/min (by C-G formula based on Cr of 0.84).   Medications:  Scheduled:    . antiseptic oral rinse  15 mL Mouth Rinse BID  . aspirin EC  81 mg Oral Daily  . docusate sodium  100 mg Oral BID  . dutasteride  0.5 mg Oral Daily  . feeding supplement  237 mL Oral TID BM  . FLUoxetine  30 mg Oral Daily  . fluticasone  2 spray Each Nare Daily  . furosemide  120 mg Intravenous Q6H  . insulin aspart  0-5 Units Subcutaneous QHS  . insulin aspart  0-9 Units Subcutaneous TID WC  . iron polysaccharides  150 mg Oral Daily  . lidocaine  1 patch Transdermal Daily  . loratadine  5 mg Oral Daily  . multivitamin with minerals  1 tablet Oral Daily  . nitroGLYCERIN  1 inch Topical Q6H  . olopatadine  1 drop Both Eyes BID  . pantoprazole  40 mg Oral BID AC  . polyethylene glycol  17 g Oral Daily  . potassium chloride  40 mEq Oral Daily  . senna-docusate  1 tablet Oral QHS  . sodium chloride  10-40 mL Intracatheter Q12H  . spironolactone  25 mg Oral BID  .  Tamsulosin HCl  0.4 mg Oral Daily  . warfarin  2 mg Oral ONCE-1800  . warfarin   Does not apply Once  . Warfarin - Pharmacist Dosing Inpatient   Does not apply q1800   Infusions:    . sodium chloride 20 mL/hr at 03/17/12 0841  . sodium chloride 20 mL/hr at 03/17/12 0841  . heparin 1,850 Units/hr (03/19/12 1907)  . milrinone 0.125 mcg/kg/min (03/19/12 1122)    Assessment: 78 yo male with DVT is not on therapeutic heparin and coumadin. Heparin level was 0.38 and INR was 2.16 with history of warfarin sensitivity.  Patient has completed 5 days of VTE overlap.  No bleeding.  H/H stable. Goal of Therapy:  INR 2-3 Monitor platelets by anticoagulation protocol: Yes   Plan:  1) Since patient has completed 5-day VTE overlap with heparin and INR is >/= 2, will d/c heparin. 2) Coumadin 2 mg po x1 3) Daily PT/INR  Mykiah Schmuck, Tsz-Yin 03/20/2012,8:50 AM

## 2012-03-20 NOTE — Progress Notes (Signed)
Pt. Alarmed 8 beats Vtach.  Pt. Asleep/asymptomatic.  BP 136/74 HR 88.  Will continue to monitor patient.

## 2012-03-20 NOTE — Progress Notes (Signed)
SUBJECTIVE:  No complaints of SOB OBJECTIVE:   Vitals:   Filed Vitals:   03/19/12 1350 03/19/12 2100 03/20/12 0327 03/20/12 0512  BP: 125/64 134/78 136/74 130/82  Pulse: 71 90 88 88  Temp: 98.6 F (37 C) 97.9 F (36.6 C) 98 F (36.7 C) 97.8 F (36.6 C)  TempSrc: Oral Oral Oral Oral  Resp: 19 20 20 20   Height:      Weight:    68.4 kg (150 lb 12.7 oz)  SpO2: 96% 96% 96% 92%   I&O's:   Intake/Output Summary (Last 24 hours) at 03/20/12 1191 Last data filed at 03/20/12 4782  Gross per 24 hour  Intake 2051.99 ml  Output   2425 ml  Net -373.01 ml   TELEMETRY: Reviewed telemetry pt in NSR:     PHYSICAL EXAM General: Well developed, well nourished, in no acute distress  Lungs:   Clear bilaterally to auscultation and percussion. Heart:   HRRR S1 S2 Pulses are 2+ & equal. Extremities:   No clubbing, cyanosis or edema.  DP +1 Neuro: Alert and oriented X 3. Psych:  Good affect, responds appropriately   LABS: Basic Metabolic Panel:  Basename 03/19/12 0710 03/18/12 0635  NA 132* 136  K 3.4* 3.5  CL 84* 85*  CO2 44* >45*  GLUCOSE 124* 114*  BUN 25* 21  CREATININE 0.81 0.78  CALCIUM 9.7 9.5  MG -- --  PHOS -- --   Liver Function Tests: No results found for this basename: AST:2,ALT:2,ALKPHOS:2,BILITOT:2,PROT:2,ALBUMIN:2 in the last 72 hours No results found for this basename: LIPASE:2,AMYLASE:2 in the last 72 hours CBC:  Basename 03/20/12 0500 03/19/12 1800  WBC 7.5 6.8  NEUTROABS -- --  HGB 9.6* 9.8*  HCT 30.6* 31.1*  MCV 73.0* 72.8*  PLT 225 199    Lab Results  Component Value Date   INR 2.16* 03/20/2012   INR 1.96* 03/19/2012   INR 1.79* 03/18/2012    RADIOLOGY: US Abdomen Limited  03/13/2012  *RADIOLOGY REPORT*  Clinical Data: Evaluate for ascites.  LIMITED ABDOMEN ULTRASOUND FOR ASCITES  Technique:  Limited ultrasound survey for ascites was performed in all four abdominal quadrants.  Comparison:  None.  Findings: Small to moderate volume abdominal and  pelvic ascites. Incidental note is made of bilateral pleural effusions.  Liver not well evaluated.  IMPRESSION:  1.  Small to moderate volume abdominal pelvic ascites. 2.  Bilateral pleural effusions.  Original Report Authenticated By: Consuello Bossier, M.D.   Dg Chest Port 1 View  03/14/2012  *RADIOLOGY REPORT*  Clinical Data: Increasing shortness of breath and weakness  PORTABLE CHEST - 1 VIEW  Comparison: Portable chest x-ray of 03/06/2012  Findings: The lungs are poorly aerated.  There is cardiomegaly present with effusions and airspace disease most consistent with edema.  A Port-A-Cath and right-sided central venous line remain.  IMPRESSION: Diminished aeration with worsening of probable CHF with effusions.   Original Report Authenticated By: Juline Patch, M.D.    Dg Chest Port 1 View  03/13/2012  *RADIOLOGY REPORT*  Clinical Data: Line placement.  PORTABLE CHEST - 1 VIEW  Comparison: 1 day prior  Findings: A right-sided PICC line followed to the level of the high right atrium. Pacer with leads at right atrium and right ventricle. No lead discontinuity.  No pneumothorax.  Moderate cardiomegaly.  Layering bilateral pleural effusions are not significantly changed.  Low lung volumes with slight improvement in mild interstitial edema.  Bibasilar airspace disease is similar.  IMPRESSION:  1.  Right-sided PICC line terminating over the high right atrium. If mid to low SVC position is desired, recommend retraction 5.8 cm. 2.  Slight improvement in mild congestive heart failure. 3.  Similar bilateral pleural effusions and bibasilar airspace disease, likely atelectasis.  Original Report Authenticated By: Consuello Bossier, M.D.   Dg Chest Portable 1 View  03/12/2012  *RADIOLOGY REPORT*  Clinical Data: 78 year old male with shortness of breath, swelling.  PORTABLE CHEST - 1 VIEW  Comparison: 12/05/2011 and earlier.  Findings: Portable semi upright AP view at 1617 hours.  More lordotic view. Stable cardiomegaly and  mediastinal contours. Stable left chest cardiac pacemaker.  Increase veiling opacity on the right.  No pneumothorax.  Stable vascular congestion, no overt pulmonary edema.  Continued bibasilar opacity.  IMPRESSION: Interval increased right pleural effusion, probably moderate. Stable vascular congestion and basilar atelectasis.  Original Report Authenticated By: Harley Hallmark, M.D.      ASSESSMENT:  1. Acute on chronic combined systolic/diastolic CHF - improving. Weight decreased 2 kg. I<O with net -11L since admit  2. Bilateral pleural effusions secondary to #1  3. CAD stable  4. Hypokalemia  5. LUE DVT on Warfarin and Heparin  6. DM  PLAN:   1.  Continue IV diuretics 2.  Recheck BMET this am to make sure potassium repleted  Quintella Reichert, MD  03/20/2012  8:38 AM

## 2012-03-21 LAB — BASIC METABOLIC PANEL
CO2: 41 mEq/L (ref 19–32)
Chloride: 88 mEq/L — ABNORMAL LOW (ref 96–112)
Glucose, Bld: 119 mg/dL — ABNORMAL HIGH (ref 70–99)
Sodium: 136 mEq/L (ref 135–145)

## 2012-03-21 LAB — GLUCOSE, CAPILLARY
Glucose-Capillary: 119 mg/dL — ABNORMAL HIGH (ref 70–99)
Glucose-Capillary: 185 mg/dL — ABNORMAL HIGH (ref 70–99)

## 2012-03-21 LAB — MAGNESIUM: Magnesium: 2 mg/dL (ref 1.5–2.5)

## 2012-03-21 LAB — PROTIME-INR: INR: 1.74 — ABNORMAL HIGH (ref 0.00–1.49)

## 2012-03-21 MED ORDER — FUROSEMIDE 10 MG/ML IJ SOLN
120.0000 mg | Freq: Two times a day (BID) | INTRAVENOUS | Status: DC
  Administered 2012-03-21 – 2012-03-22 (×2): 120 mg via INTRAVENOUS
  Filled 2012-03-21 (×3): qty 12

## 2012-03-21 MED ORDER — POTASSIUM CHLORIDE CRYS ER 20 MEQ PO TBCR
40.0000 meq | EXTENDED_RELEASE_TABLET | Freq: Two times a day (BID) | ORAL | Status: DC
  Administered 2012-03-21: 40 meq via ORAL

## 2012-03-21 MED ORDER — WARFARIN SODIUM 3 MG PO TABS
3.0000 mg | ORAL_TABLET | Freq: Once | ORAL | Status: AC
  Administered 2012-03-21: 3 mg via ORAL
  Filled 2012-03-21: qty 1

## 2012-03-21 MED ORDER — POTASSIUM CHLORIDE CRYS ER 20 MEQ PO TBCR
40.0000 meq | EXTENDED_RELEASE_TABLET | Freq: Every day | ORAL | Status: DC
  Administered 2012-03-22 – 2012-03-24 (×3): 40 meq via ORAL
  Filled 2012-03-21 (×3): qty 2

## 2012-03-21 NOTE — Progress Notes (Signed)
Nutrition Follow-up  Intervention:   1. Add meal preferences to health touch 2. Continue with supplements as ordered  3. RD will continue to follow    Assessment:   Pt wife states chopped meals are going better, but still with some problems swallowing. Drinking supplements.  Continues to diurese, IV lasix one more day per notes.   Diet Order:  Dysphagia 3, thin liquids.   Meds: Scheduled Meds:   . antiseptic oral rinse  15 mL Mouth Rinse BID  . aspirin EC  81 mg Oral Daily  . docusate sodium  100 mg Oral BID  . dutasteride  0.5 mg Oral Daily  . feeding supplement  237 mL Oral TID BM  . FLUoxetine  30 mg Oral Daily  . fluticasone  2 spray Each Nare Daily  . furosemide  120 mg Intravenous Q12H  . insulin aspart  0-5 Units Subcutaneous QHS  . insulin aspart  0-9 Units Subcutaneous TID WC  . iron polysaccharides  150 mg Oral Daily  . lidocaine  1 patch Transdermal Daily  . loratadine  5 mg Oral Daily  . multivitamin with minerals  1 tablet Oral Daily  . nitroGLYCERIN  1 inch Topical Q6H  . olopatadine  1 drop Both Eyes BID  . pantoprazole  40 mg Oral BID AC  . polyethylene glycol  17 g Oral Daily  . potassium chloride  40 mEq Oral BID  . senna-docusate  1 tablet Oral QHS  . sodium chloride  10-40 mL Intracatheter Q12H  . spironolactone  25 mg Oral BID  . Tamsulosin HCl  0.4 mg Oral Daily  . warfarin  2 mg Oral ONCE-1800  . warfarin  3 mg Oral ONCE-1800  . warfarin   Does not apply Once  . Warfarin - Pharmacist Dosing Inpatient   Does not apply q1800  . DISCONTD: furosemide  120 mg Intravenous Q6H  . DISCONTD: potassium chloride  40 mEq Oral TID   Continuous Infusions:   . sodium chloride 20 mL/hr at 03/21/12 0700  . sodium chloride 20 mL/hr at 03/17/12 0841  . DISCONTD: milrinone 0.126 mcg/kg/min (03/21/12 0700)   PRN Meds:.artificial tears, LORazepam, ondansetron (ZOFRAN) IV, ondansetron, sodium chloride, sodium chloride, zolpidem  Labs:  CMP     Component Value  Date/Time   NA 136 03/21/2012 0505   K 4.4 03/21/2012 0505   CL 88* 03/21/2012 0505   CO2 41* 03/21/2012 0505   GLUCOSE 119* 03/21/2012 0505   BUN 31* 03/21/2012 0505   CREATININE 0.91 03/21/2012 0505   CALCIUM 10.1 03/21/2012 0505   PROT 6.5 03/14/2012 1415   ALBUMIN 2.5* 03/14/2012 1415   AST 28 03/14/2012 1415   ALT 9 03/14/2012 1415   ALKPHOS 42 03/14/2012 1415   BILITOT 0.7 03/14/2012 1415   GFRNONAA 76* 03/21/2012 0505   GFRAA 88* 03/21/2012 0505     Intake/Output Summary (Last 24 hours) at 03/21/12 1416 Last data filed at 03/21/12 1321  Gross per 24 hour  Intake 1387.9 ml  Output   2400 ml  Net -1012.1 ml    Weight Status:  145 lbs, trending down Body mass index is 22.16 kg/(m^2). Pt is WNL based on current BMI.   Re-estimated needs:  1700-1900 kcal, 85-95 gm protein  Nutrition Dx:  Increased nutrient needs r/t wound healing AEB estimated needs  Goal:  Pt to meet >/=90% estimated nutrition needs, met  Monitor:  PO intake, weight, labs, wound healing    Clarene Duke RD, LDN Pager #  [phone] After Hours pager [phone]

## 2012-03-21 NOTE — Progress Notes (Signed)
[facility] PROGRESS NOTE  Ivan [name]:[id number] DOB: 07-20-1927 DOA: 03/12/2012 PCP: Oliver Barre, MD  Assessment/Plan: 1. Acute systolic CHF--slowly improving. Continue Lasix per cardiology. Milrinone stopped.   2. Bilateral pleural effusions, likely secondary to CHF exacerbation and transudative. No need for thoracentesis at this time.  3. CAD--Stable. Continue ASA.  4. Hypokalemia: Continue daily repletion. Magnesium normal.  5. Acute kidney injury, resolved. 6. LUE DVT--warfarin per pharmacy. Monitor for signs of bleeding given recent GIB and biopsy  7. Abdominal discomfort--resolved s/p paracentesis.  8. Left leg ulcer--Wound care. 9. Diabetes--Stable. Continue SSI.  10. Anemia, stable. 11. GERD--Continue PPI.  Code Status: DNR/DNI. Do not reapply bipap. Continue diuresis, but if decompensates, make comfort care only.  Family Communication: discussed with daughter at bedside 8/26 Disposition Plan: Return to SNF, alternatively, consider LTAC  [name], MD  [facility] Team 8 Pager [phone]. If 8PM-8AM, please contact night-coverage at [email or web address], password [facility] 03/21/2012, 7:23 PM  LOS: 9 days   Brief narrative:  85yom with history of CAD s/p MI. He has been becoming progressively more short of breath. He had a CXR in the morning ordered by the nurse practitioner at the [facility] nursing home where he stays and it showed pulmonary edema and pleural effusions. He was transported to the [facility] for further evalaution. He has had a nonproductive cough, worsening swelling of the left arm, and a new fluid blister of the left leg which burst recently. He feels like drowning when he lies back. Dyspnea without exertion.  Per history, he has had a possible DVT in the left arm previously which was treated with coumadin. He has not been on coumadin for several weeks due to his GI bleed and because he recently underwent EGD with biopsy on 02/27/12.    Consultants:  Cardiology  Critical care Procedures:  Abd [place] 8/18  PICC line 8/18  HPI/Subjective: Feels ok.  Objective: Filed Vitals:   03/20/12 2235 03/21/12 0543 03/21/12 1440 03/21/12 1720  BP: 132/77 129/77 120/58 122/60  Pulse: 90 90 75 78  Temp: 98.2 F (36.8 C) 98.1 F (36.7 C) 97.7 F (36.5 C)   TempSrc: Oral Oral Oral   Resp: 20 20 20    Height:      Weight:  66.1 kg (145 lb 11.6 oz)    SpO2: 96% 95% 100%     Intake/Output Summary (Last 24 hours) at 03/21/12 1923 Last data filed at 03/21/12 1813  Gross per 24 hour  Intake 1355.2 ml  Output   1600 ml  Net -244.8 ml   Filed Weights   03/19/12 0501 03/20/12 0512 03/21/12 0543  Weight: 68.7 kg (151 lb 7.3 oz) 68.4 kg (150 lb 12.7 oz) 66.1 kg (145 lb 11.6 oz)     Exam:   General:  Appears calm and comfortable.  Cardiovascular: RRR, no m/r/g. No LE edema left LLE, right AKA.  Respiratory: CTA bilaterally, no w/r/r. Normal respiratory effort.  Abdomen: soft, nt, mildly distended  Data Reviewed: Basic Metabolic Panel:  Lab 03/21/12 8657 03/20/12 0500 03/19/12 0710 03/18/12 0635 03/17/12 0900 03/16/12 1629 03/16/12 0515 03/15/12 1710 03/15/12 0355  NA 136 133* 132* 136 137 -- -- -- --  K 4.4 3.2* 3.4* 3.5 3.6 -- -- -- --  CL 88* 84* 84* 85* 86* -- -- -- --  CO2 41* 40* 44* >45* >45* -- -- -- --  GLUCOSE 119* 118* 124* 114* 122* -- -- -- --  BUN 31* 28* 25* 21 19 -- -- -- --  CREATININE 0.91 0.84 0.81 0.78 0.74 -- -- -- --  CALCIUM 10.1 9.4 9.7 9.5 9.5 -- -- -- --  MG 2.0 -- -- -- -- 1.6 1.7 1.6 1.9  PHOS -- -- -- -- -- -- -- -- --   CBC:  Lab 03/20/12 0500 03/19/12 1800 03/18/12 1725 03/17/12 0800 03/16/12 0515  WBC 7.5 6.8 6.7 6.4 5.9  NEUTROABS -- -- -- -- --  HGB 9.6* 9.8* 9.7* 9.5* 9.0*  HCT 30.6* 31.1* 30.3* 30.7* 29.3*  MCV 73.0* 72.8* 73.5* 74.3* 75.9*  PLT 225 199 183 217 207    Basename 03/12/12 1552 12/04/11 1053  PROBNP 23086.0* 6095.0*   CBG:  Lab 03/21/12 1638 03/21/12  1211 03/21/12 0607 03/20/12 2141 03/20/12 1559  GLUCAP 106* 185* 119* 134* 121*    Recent Results (from the past 240 hour(s))  MRSA PCR SCREENING     Status: Normal   Collection Time   03/12/12 11:12 PM      Component Value Range Status Comment   MRSA by PCR NEGATIVE  NEGATIVE Final     Studies: No results found.  Scheduled Meds:    . antiseptic oral rinse  15 mL Mouth Rinse BID  . aspirin EC  81 mg Oral Daily  . docusate sodium  100 mg Oral BID  . dutasteride  0.5 mg Oral Daily  . feeding supplement  237 mL Oral TID BM  . FLUoxetine  30 mg Oral Daily  . fluticasone  2 spray Each Nare Daily  . furosemide  120 mg Intravenous Q12H  . insulin aspart  0-5 Units Subcutaneous QHS  . insulin aspart  0-9 Units Subcutaneous TID WC  . iron polysaccharides  150 mg Oral Daily  . lidocaine  1 patch Transdermal Daily  . loratadine  5 mg Oral Daily  . multivitamin with minerals  1 tablet Oral Daily  . nitroGLYCERIN  1 inch Topical Q6H  . olopatadine  1 drop Both Eyes BID  . pantoprazole  40 mg Oral BID AC  . polyethylene glycol  17 g Oral Daily  . potassium chloride  40 mEq Oral BID  . senna-docusate  1 tablet Oral QHS  . sodium chloride  10-40 mL Intracatheter Q12H  . spironolactone  25 mg Oral BID  . Tamsulosin HCl  0.4 mg Oral Daily  . warfarin  3 mg Oral ONCE-1800  . warfarin   Does not apply Once  . Warfarin - Pharmacist Dosing Inpatient   Does not apply q1800  . DISCONTD: furosemide  120 mg Intravenous Q6H  . DISCONTD: potassium chloride  40 mEq Oral TID   Continuous Infusions:    . sodium chloride 20 mL/hr at 03/21/12 0700  . sodium chloride 20 mL/hr at 03/17/12 0841  . DISCONTD: milrinone 0.126 mcg/kg/min (03/21/12 0700)    Principal Problem:  *Acute exacerbation of congestive heart failure Active Problems:  DIABETES MELLITUS, TYPE II  ANXIETY  HYPERTENSION  PERIPHERAL VASCULAR DISEASE  AV block, 2nd degree  Weakness  Anemia  Pleural effusion  Respiratory  failure with hypercapnia  Acute kidney injury  Infected decubitus ulcer  Protein malnutrition  DVT (deep venous thrombosis)     [name], MD  [facility] Team 8 Pager [phone]. If 8PM-8AM, please contact night-coverage at [email or web address], password [facility] 03/21/2012, 7:23 PM  LOS: 9 days   Time spent: 15 minutes

## 2012-03-21 NOTE — Progress Notes (Signed)
Patient Name: Ivan [name] Date of Encounter: 03/21/2012    SUBJECTIVE: Feels better. Poor appetite  TELEMETRY:  NSR: Filed Vitals:   03/20/12 0946 03/20/12 1419 03/20/12 2235 03/21/12 0543  BP: 123/70 120/60 132/77 129/77  Pulse: 69 80 90 90  Temp:  98 F (36.7 C) 98.2 F (36.8 C) 98.1 F (36.7 C)  TempSrc:  Oral Oral Oral  Resp:  19 20 20   Height:      Weight:    66.1 kg (145 lb 11.6 oz)  SpO2:  99% 96% 95%    Intake/Output Summary (Last 24 hours) at 03/21/12 0910 Last data filed at 03/21/12 0852  Gross per 24 hour  Intake 1271.9 ml  Output   2700 ml  Net -1428.1 ml   NET I/O since admission: -12,729 Liters  LABS: Basic Metabolic Panel:  Basename 03/21/12 0505 03/20/12 0500  NA 136 133*  K 4.4 3.2*  CL 88* 84*  CO2 41* 40*  GLUCOSE 119* 118*  BUN 31* 28*  CREATININE 0.91 0.84  CALCIUM 10.1 9.4  MG 2.0 --  PHOS -- --   CBC:  Basename 03/20/12 0500 03/19/12 1800  WBC 7.5 6.8  NEUTROABS -- --  HGB 9.6* 9.8*  HCT 30.6* 31.1*  MCV 73.0* 72.8*  PLT 225 199   Radiology/Studies:  N/A  Physical Exam: Blood pressure 129/77, pulse 90, temperature 98.1 F (36.7 C), temperature source Oral, resp. rate 20, height 5\' 8"  (1.727 m), weight 66.1 kg (145 lb 11.6 oz), SpO2 95.00%. Weight change: -2.3 kg (-5 lb 1.1 oz)   Clear anteriorly  Soft S3  ASSESSMENT:  1. Acute on chronic systolic and diastolic heart failure, improved  2. Azotenia   Plan:  1. D/C milrinone 2. IV lasix one more day.  [name] 03/21/2012, 9:10 AM

## 2012-03-21 NOTE — Progress Notes (Signed)
CRITICAL VALUE ALERT  Critical value received:  CO2 41  Date of notification:  03/21/2012  Time of notification:  0650  Critical value read back:yes  Nurse who received alert:  Barbarann Ehlers, RN  MD notified (1st page):  M. Lynch  Time of first page:  0654  MD notified (2nd page):  Time of second page:  Responding MD:  M. Burnadette Peter  Time MD responded:  [phone]

## 2012-03-21 NOTE — Progress Notes (Signed)
ANTICOAGULATION CONSULT NOTE - Follow Up Consult  Pharmacy Consult for Coumadin Indication: DVT  Allergies  Allergen Reactions  . Sulfa Antibiotics Rash  . Atorvastatin   . Penicillins   . Simvastatin     Patient Measurements: Height: 5\' 8"  (172.7 cm) Weight: 145 lb 11.6 oz (66.1 kg) IBW/kg (Calculated) : 68.4  Heparin Dosing Weight:   Vital Signs: Temp: 98.1 F (36.7 C) (08/26 0543) Temp src: Oral (08/26 0543) BP: 129/77 mmHg (08/26 0543) Pulse Rate: 90  (08/26 0543)  Labs:  Basename 03/21/12 0505 03/20/12 0500 03/19/12 1800 03/19/12 0710 03/18/12 1725  HGB -- 9.6* 9.8* -- --  HCT -- 30.6* 31.1* -- 30.3*  PLT -- 225 199 -- 183  APTT -- -- -- -- --  LABPROT 20.7* 24.5* -- 22.7* --  INR 1.74* 2.16* -- 1.96* --  HEPARINUNFRC -- 0.38 0.15* <0.10* --  CREATININE 0.91 0.84 -- 0.81 --  CKTOTAL -- -- -- -- --  CKMB -- -- -- -- --  TROPONINI -- -- -- -- --    Estimated Creatinine Clearance: 57.5 ml/min (by C-G formula based on Cr of 0.91).  Assessment: 85yom on Coumadin for DVT. INR (1.74) is subtherapeutic after trending down overnight. Heparin drip was discontinued 8/25 following 5 day overlap and therapeutic INR. Patient with known sensitivity to Coumadin and recent GI bleed - will increase dose conservatively.  - No CBC this AM - No significant bleeding reported - AST/ALT wnl  Goal of Therapy:  INR 2-3   Plan:  1. Coumadin 3mg  po x 1 today 2. Follow-up AM INR and CBC  Cleon Dew [phone] 03/21/2012,1:47 PM

## 2012-03-22 ENCOUNTER — Inpatient Hospital Stay (HOSPITAL_COMMUNITY): Payer: PRIVATE HEALTH INSURANCE

## 2012-03-22 LAB — GLUCOSE, CAPILLARY
Glucose-Capillary: 131 mg/dL — ABNORMAL HIGH (ref 70–99)
Glucose-Capillary: 132 mg/dL — ABNORMAL HIGH (ref 70–99)
Glucose-Capillary: 136 mg/dL — ABNORMAL HIGH (ref 70–99)

## 2012-03-22 LAB — BASIC METABOLIC PANEL
CO2: 38 mEq/L — ABNORMAL HIGH (ref 19–32)
Chloride: 90 mEq/L — ABNORMAL LOW (ref 96–112)
Glucose, Bld: 128 mg/dL — ABNORMAL HIGH (ref 70–99)
Potassium: 3.8 mEq/L (ref 3.5–5.1)
Sodium: 137 mEq/L (ref 135–145)

## 2012-03-22 MED ORDER — WARFARIN SODIUM 3 MG PO TABS
3.0000 mg | ORAL_TABLET | Freq: Once | ORAL | Status: AC
  Administered 2012-03-22: 3 mg via ORAL
  Filled 2012-03-22: qty 1

## 2012-03-22 MED ORDER — ISOSORB DINITRATE-HYDRALAZINE 20-37.5 MG PO TABS
1.0000 | ORAL_TABLET | Freq: Two times a day (BID) | ORAL | Status: DC
  Administered 2012-03-22 – 2012-03-24 (×5): 1 via ORAL
  Filled 2012-03-22 (×6): qty 1

## 2012-03-22 MED ORDER — TORSEMIDE 20 MG PO TABS
60.0000 mg | ORAL_TABLET | Freq: Two times a day (BID) | ORAL | Status: DC
  Administered 2012-03-23 – 2012-03-24 (×3): 60 mg via ORAL
  Filled 2012-03-22 (×5): qty 3

## 2012-03-22 MED ORDER — SENNA 8.6 MG PO TABS
2.0000 | ORAL_TABLET | Freq: Every day | ORAL | Status: DC
  Administered 2012-03-22: 17.2 mg via ORAL
  Filled 2012-03-22 (×3): qty 2

## 2012-03-22 MED ORDER — POLYETHYLENE GLYCOL 3350 17 G PO PACK
17.0000 g | PACK | Freq: Two times a day (BID) | ORAL | Status: DC
  Administered 2012-03-22 – 2012-03-24 (×3): 17 g via ORAL
  Filled 2012-03-22 (×5): qty 1

## 2012-03-22 MED ORDER — FLEET ENEMA 7-19 GM/118ML RE ENEM
1.0000 | ENEMA | Freq: Once | RECTAL | Status: AC
  Administered 2012-03-22: 1 via RECTAL
  Filled 2012-03-22: qty 1

## 2012-03-22 NOTE — Progress Notes (Signed)
Cosigned Tara Parker's assessment, I/O, note(s), med administration and care plan/education.  D. Grainger Mccarley RN 

## 2012-03-22 NOTE — Progress Notes (Signed)
Patient Name: Ivan [name] Date of Encounter: 03/22/2012    SUBJECTIVE: Feels okay. Looks dry.  TELEMETRY:  AV seq pace: Filed Vitals:   03/21/12 1440 03/21/12 1720 03/21/12 2052 03/22/12 0543  BP: 120/58 122/60 107/63 121/59  Pulse: 75 78 83 71  Temp: 97.7 F (36.5 C)  97.5 F (36.4 C) 97.5 F (36.4 C)  TempSrc: Oral  Oral Oral  Resp: 20  19 22   Height:      Weight:    62 kg (136 lb 11 oz)  SpO2: 100%  99% 100%    Intake/Output Summary (Last 24 hours) at 03/22/12 0841 Last data filed at 03/22/12 0600  Gross per 24 hour  Intake   1362 ml  Output   1000 ml  Net    362 ml    LABS: Basic Metabolic Panel:  Basename 03/22/12 0519 03/21/12 0505  NA 137 136  K 3.8 4.4  CL 90* 88*  CO2 38* 41*  GLUCOSE 128* 119*  BUN 39* 31*  CREATININE 0.96 0.91  CALCIUM 9.9 10.1  MG -- 2.0  PHOS -- --   CBC:  Basename 03/20/12 0500 03/19/12 1800  WBC 7.5 6.8  NEUTROABS -- --  HGB 9.6* 9.8*  HCT 30.6* 31.1*  MCV 73.0* 72.8*  PLT 225 199    Radiology/Studies:  No new  Physical Exam: Blood pressure 121/59, pulse 71, temperature 97.5 F (36.4 C), temperature source Oral, resp. rate 22, height 5\' 8"  (1.727 m), weight 62 kg (136 lb 11 oz), SpO2 100.00%. Weight change: -4.1 kg (-9 lb 0.6 oz)   S3.  Clear lungs anteriorly. Still with dependent edema  ASSESSMENT:  1. Acute on chronic systolic and diastolic heart failure, improving  2. Azotemia due to diuresis and stopping milrinone  Plan:  1. Hold Loop diuretic today. 2. Start demadex in AM 3. Follow renal function 4. Possible discharge 48-72 hours  Signed, Lesleigh Noe 03/22/2012, 8:41 AM

## 2012-03-22 NOTE — Progress Notes (Signed)
[facility] PROGRESS NOTE  JAS BETTEN OZH:[id number] DOB: 1927-04-10 DOA: 03/12/2012 PCP: Oliver Barre, MD  Assessment/Plan: 1. Acute systolic CHF--slowly improving, appears asymptomatic. Lasix and Milrinone stopped per cardiology secondary to azotemia.  Management per cardiology. 2. Constipation--patient complains of constipation and abdominal pain, but AXR with only mild stool burden and 4 stools charted last 24 hours. Continue colace, senna; increase Miralax to BID. Try disimpaction, enema. 3. Bilateral pleural effusions, likely secondary to CHF exacerbation. No need for thoracentesis.  4. CAD--Stable. Continue ASA.  5. Hypokalemia: Continue daily repletion.  6. Acute kidney injury, resolved. 7. LUE DVT--warfarin per pharmacy. Monitor for signs of bleeding given recent GIB and biopsy  8. Left leg ulcer--Wound care. 9. Diabetes--Stable. Continue SSI.  10. Anemia, stable. 11. GERD--Continue PPI.  Code Status: DNR/DNI. Do not reapply bipap. Continue diuresis, but if decompensates, make comfort care only.  Family Communication: discussed with daughter at bedside 8/26 Disposition Plan: Return to SNF 48 hours?  [name], MD  [facility] Team 8 Pager [phone]. If 8PM-8AM, please contact night-coverage at [email or web address], password [facility] 03/22/2012, 6:45 PM  LOS: 10 days   Brief narrative:  85yom with history of CAD s/p MI. He has been becoming progressively more short of breath. He had a CXR in the morning ordered by the nurse practitioner at the [facility] nursing home where he stays and it showed pulmonary edema and pleural effusions. He was transported to the [facility] for further evalaution. He has had a nonproductive cough, worsening swelling of the left arm, and a new fluid blister of the left leg which burst recently. He feels like drowning when he lies back. Dyspnea without exertion.  Per history, he has had a possible DVT in the left arm previously which was  treated with coumadin. He has not been on coumadin for several weeks due to his GI bleed and because he recently underwent EGD with biopsy on 02/27/12.   Consultants:  Cardiology  Critical care Procedures:  Abd [place] 8/18  PICC line 8/18  HPI/Subjective: Feels ok, complains of constipation.  Objective: Filed Vitals:   03/22/12 0543 03/22/12 1100 03/22/12 1500 03/22/12 1755  BP: 121/59 104/50 150/55 123/52  Pulse: 71 77 74 76  Temp: 97.5 F (36.4 C) 98.3 F (36.8 C) 98 F (36.7 C)   TempSrc: Oral Axillary Oral   Resp: 22 20 20 20   Height:      Weight: 62 kg (136 lb 11 oz)     SpO2: 100% 100% 100% 99%    Intake/Output Summary (Last 24 hours) at 03/22/12 1845 Last data filed at 03/22/12 1838  Gross per 24 hour  Intake   1122 ml  Output   1601 ml  Net   -479 ml   Filed Weights   03/20/12 0512 03/21/12 0543 03/22/12 0543  Weight: 68.4 kg (150 lb 12.7 oz) 66.1 kg (145 lb 11.6 oz) 62 kg (136 lb 11 oz)     Exam:   General:  Appears calm, non-toxic.  Cardiovascular: RRR, no m/r/g. No LE edema left LLE, right AKA.  Respiratory: CTA bilaterally, no w/r/r. Normal respiratory effort.  Abdomen: soft, nt, mildly distended  Exam current 8/27  Data Reviewed: Basic Metabolic Panel:  Lab 03/22/12 1324 03/21/12 0505 03/20/12 0500 03/19/12 0710 03/18/12 0635 03/16/12 1629 03/16/12 0515  NA 137 136 133* 132* 136 -- --  K 3.8 4.4 3.2* 3.4* 3.5 -- --  CL 90* 88* 84* 84* 85* -- --  CO2 38* 41*  40* 44* >45* -- --  GLUCOSE 128* 119* 118* 124* 114* -- --  BUN 39* 31* 28* 25* 21 -- --  CREATININE 0.96 0.91 0.84 0.81 0.78 -- --  CALCIUM 9.9 10.1 9.4 9.7 9.5 -- --  MG -- 2.0 -- -- -- 1.6 1.7  PHOS -- -- -- -- -- -- --   CBC:  Lab 03/20/12 0500 03/19/12 1800 03/18/12 1725 03/17/12 0800 03/16/12 0515  WBC 7.5 6.8 6.7 6.4 5.9  NEUTROABS -- -- -- -- --  HGB 9.6* 9.8* 9.7* 9.5* 9.0*  HCT 30.6* 31.1* 30.3* 30.7* 29.3*  MCV 73.0* 72.8* 73.5* 74.3* 75.9*  PLT 225 199 183 217 207     Basename 03/12/12 1552 12/04/11 1053  PROBNP 23086.0* 6095.0*   CBG:  Lab 03/22/12 1634 03/22/12 1104 03/22/12 0606 03/21/12 2050 03/21/12 1638  GLUCAP 136* 132* 131* 129* 106*    Recent Results (from the past 240 hour(s))  MRSA PCR SCREENING     Status: Normal   Collection Time   03/12/12 11:12 PM      Component Value Range Status Comment   MRSA by PCR NEGATIVE  NEGATIVE Final     Studies: Dg Abd Portable 2v  03/22/2012  *RADIOLOGY REPORT*  Clinical Data: Abdominal pain.  Constipation.  Evaluate stool burden and possible ileus.  PORTABLE ABDOMEN - 2 VIEW  Comparison: None.  Findings: The bowel gas pattern is nonobstructive.  There does not appear be significantly increased stool throughout the colon.  The stool in the rectum is mildly prominent.  There is no free intraperitoneal air.  Diffuse vascular calcifications are noted. There is diffuse lumbar spondylosis with possible sacroiliac joint ankylosis, especially on the left.  The patient is status post right hip arthroplasty.  There is diffuse left hip joint space loss.  IMPRESSION:  Mildly increased stool in the rectum.  Otherwise no significantly increased stool burden or evidence of bowel obstruction.   Original Report Authenticated By: Gerrianne Scale, M.D.     Scheduled Meds:    . antiseptic oral rinse  15 mL Mouth Rinse BID  . aspirin EC  81 mg Oral Daily  . docusate sodium  100 mg Oral BID  . dutasteride  0.5 mg Oral Daily  . feeding supplement  237 mL Oral TID BM  . FLUoxetine  30 mg Oral Daily  . fluticasone  2 spray Each Nare Daily  . insulin aspart  0-5 Units Subcutaneous QHS  . insulin aspart  0-9 Units Subcutaneous TID WC  . iron polysaccharides  150 mg Oral Daily  . isosorbide-hydrALAZINE  1 tablet Oral BID  . lidocaine  1 patch Transdermal Daily  . loratadine  5 mg Oral Daily  . multivitamin with minerals  1 tablet Oral Daily  . olopatadine  1 drop Both Eyes BID  . pantoprazole  40 mg Oral BID AC  .  polyethylene glycol  17 g Oral Daily  . potassium chloride  40 mEq Oral Daily  . senna-docusate  1 tablet Oral QHS  . sodium chloride  10-40 mL Intracatheter Q12H  . sodium phosphate  1 enema Rectal Once  . spironolactone  25 mg Oral BID  . Tamsulosin HCl  0.4 mg Oral Daily  . torsemide  60 mg Oral BID  . warfarin  3 mg Oral ONCE-1800  . warfarin   Does not apply Once  . Warfarin - Pharmacist Dosing Inpatient   Does not apply q1800  . DISCONTD: furosemide  120 mg  Intravenous Q12H  . DISCONTD: nitroGLYCERIN  1 inch Topical Q6H  . DISCONTD: potassium chloride  40 mEq Oral BID   Continuous Infusions:    . sodium chloride 20 mL/hr at 03/21/12 0700  . sodium chloride 20 mL/hr at 03/17/12 1610    Principal Problem:  *Acute exacerbation of congestive heart failure Active Problems:  DIABETES MELLITUS, TYPE II  ANXIETY  HYPERTENSION  PERIPHERAL VASCULAR DISEASE  AV block, 2nd degree  Weakness  Anemia  Pleural effusion  Respiratory failure with hypercapnia  Acute kidney injury  Infected decubitus ulcer  Protein malnutrition  DVT (deep venous thrombosis)     [name], MD  [facility] Team 8 Pager [phone]. If 8PM-8AM, please contact night-coverage at [email or web address], password [facility] 03/22/2012, 6:45 PM  LOS: 10 days   Time spent: 20 minutes

## 2012-03-22 NOTE — Progress Notes (Signed)
Patient given enema; will continue to monitor patient.  Lorretta Harp RN

## 2012-03-22 NOTE — Progress Notes (Signed)
CSW provided updated clinicals to [facility]. CSW is continuing to follow and will assist with all d/c needs when patient is medically stable.   Sabino Niemann, MSW, [employer] [phone]

## 2012-03-22 NOTE — Progress Notes (Signed)
ANTICOAGULATION CONSULT NOTE - Follow Up Consult  Pharmacy Consult for Coumadin Indication: DVT  Allergies  Allergen Reactions  . Sulfa Antibiotics Rash  . Atorvastatin   . Penicillins   . Simvastatin     Patient Measurements: Height: 5\' 8"  (172.7 cm) Weight: 136 lb 11 oz (62 kg) IBW/kg (Calculated) : 68.4  Heparin Dosing Weight:   Vital Signs: Temp: 97.5 F (36.4 C) (08/27 0543) Temp src: Oral (08/27 0543) BP: 121/59 mmHg (08/27 0543) Pulse Rate: 71  (08/27 0543)  Labs:  Alvira Philips 03/22/12 0519 03/21/12 0505 03/20/12 0500 03/19/12 1800  HGB -- -- 9.6* 9.8*  HCT -- -- 30.6* 31.1*  PLT -- -- 225 199  APTT -- -- -- --  LABPROT 21.6* 20.7* 24.5* --  INR 1.84* 1.74* 2.16* --  HEPARINUNFRC -- -- 0.38 0.15*  CREATININE 0.96 0.91 0.84 --  CKTOTAL -- -- -- --  CKMB -- -- -- --  TROPONINI -- -- -- --    Estimated Creatinine Clearance: 51.1 ml/min (by C-G formula based on Cr of 0.96).  Assessment: 85yom on Coumadin for DVT. INR (1.84) is just below goal range but increased overnight with Coumadin 3mg . Heparin drip was discontinued 8/25 following 5 day overlap and therapeutic INR. Patient with known sensitivity to Coumadin and recent GI bleed - will continue to dose conservatively.  - No CBC this AM  - No significant bleeding reported  - AST/ALT wnl  Goal of Therapy:  INR 2-3   Plan:  1. Repeat Coumadin 3mg  po x 1 today 2. Follow-up AM INR  Cleon Dew [phone] 03/22/2012,9:28 AM

## 2012-03-23 DIAGNOSIS — N400 Enlarged prostate without lower urinary tract symptoms: Secondary | ICD-10-CM

## 2012-03-23 LAB — GLUCOSE, CAPILLARY: Glucose-Capillary: 169 mg/dL — ABNORMAL HIGH (ref 70–99)

## 2012-03-23 LAB — BASIC METABOLIC PANEL
BUN: 48 mg/dL — ABNORMAL HIGH (ref 6–23)
Calcium: 9.7 mg/dL (ref 8.4–10.5)
Chloride: 88 mEq/L — ABNORMAL LOW (ref 96–112)
Creatinine, Ser: 1.1 mg/dL (ref 0.50–1.35)
GFR calc Af Amer: 70 mL/min — ABNORMAL LOW (ref >90)

## 2012-03-23 LAB — PROTIME-INR: Prothrombin Time: 25.7 seconds — ABNORMAL HIGH (ref 11.6–15.2)

## 2012-03-23 MED ORDER — WARFARIN SODIUM 2.5 MG PO TABS
2.5000 mg | ORAL_TABLET | Freq: Every day | ORAL | Status: DC
  Administered 2012-03-23: 2.5 mg via ORAL
  Filled 2012-03-23 (×2): qty 1

## 2012-03-23 NOTE — Progress Notes (Signed)
Patient Name: Ivan [name] Date of Encounter: 03/23/2012    SUBJECTIVE:No complaint.  TELEMETRY:  NSR and pacing: Filed Vitals:   03/22/12 1500 03/22/12 1755 03/22/12 2118 03/23/12 0632  BP: 150/55 123/52 101/54 133/38  Pulse: 74 76 73 78  Temp: 98 F (36.7 C)  98.9 F (37.2 C) 98.5 F (36.9 C)  TempSrc: Oral  Oral Oral  Resp: 20 20 18 20   Height:      Weight:    61.8 kg (136 lb 3.9 oz)  SpO2: 100% 99% 95% 99%    Intake/Output Summary (Last 24 hours) at 03/23/12 0751 Last data filed at 03/23/12 0700  Gross per 24 hour  Intake   1697 ml  Output   1351 ml  Net    346 ml    LABS: Basic Metabolic Panel:  Basename 03/23/12 0510 03/22/12 0519 03/21/12 0505  NA 130* 137 --  K 3.8 3.8 --  CL 88* 90* --  CO2 36* 38* --  GLUCOSE 122* 128* --  BUN 48* 39* --  CREATININE 1.10 0.96 --  CALCIUM 9.7 9.9 --  MG -- -- 2.0  PHOS -- -- --   Radiology/Studies:  No new.  Physical Exam: Blood pressure 133/38, pulse 78, temperature 98.5 F (36.9 C), temperature source Oral, resp. rate 20, height 5\' 8"  (1.727 m), weight 61.8 kg (136 lb 3.9 oz), SpO2 99.00%. Weight change: -0.2 kg (-7.1 oz)   Clear anteriorly  ASSESSMENT:  1. Acute on chronic combined systolic and diastolic heart failure  2. Acute kidney injury, due to diuresis  Plan:  1. Start oral loop diuretic (Demadex) today and follow renal function. Goal to maintain even I/O.  [name] 03/23/2012, 7:51 AM

## 2012-03-23 NOTE — Progress Notes (Signed)
Cosigned for Tara Parker's assessment, I/O, note(s), med administration and care plan/education.  D. Aria Jarrard RN 

## 2012-03-23 NOTE — Progress Notes (Signed)
ANTICOAGULATION CONSULT NOTE - Follow Up Consult  Pharmacy Consult: Coumadin Indication: DVT  Allergies  Allergen Reactions  . Sulfa Antibiotics Rash  . Atorvastatin   . Penicillins   . Simvastatin     Patient Measurements: Height: 5\' 8"  (172.7 cm) Weight: 136 lb 3.9 oz (61.8 kg) (bedscale) IBW/kg (Calculated) : 68.4   Vital Signs: Temp: 98.6 F (37 C) (08/28 0955) Temp src: Oral (08/28 0955) BP: 111/37 mmHg (08/28 0955) Pulse Rate: 70  (08/28 0955)  Labs:  Basename 03/23/12 0510 03/22/12 0519 03/21/12 0505  HGB -- -- --  HCT -- -- --  PLT -- -- --  APTT -- -- --  LABPROT 25.7* 21.6* 20.7*  INR 2.30* 1.84* 1.74*  HEPARINUNFRC -- -- --  CREATININE 1.10 0.96 0.91  CKTOTAL -- -- --  CKMB -- -- --  TROPONINI -- -- --    Estimated Creatinine Clearance: 44.5 ml/min (by C-G formula based on Cr of 1.1).    Assessment: 37 YOM admitted 03/12/2012 for increased SOB and found to have LUE VTE. Pharmacy consulted to manage Coumadin for new LUE DVT. He has had a history of UE DVT before and was taken off of Coumadin for several weeks due to a recent GIB.  INR therapeutic again, no bleeding documented.   Goal of Therapy:  INR 2-3    Plan:  - Coumadin 2.5mg  PO daily - Daily PT / INR - Consider resuming home med:  Crestor    Ivan [name] D. Laney Potash, PharmD, BCPS Pager:  [phone] 03/23/2012, 10:12 AM

## 2012-03-23 NOTE — Progress Notes (Signed)
[facility] PROGRESS NOTE  Ivan [name] GNF:[id number] DOB: April 27, 1927 DOA: 03/12/2012 PCP: Oliver Barre, MD  Assessment/Plan: 1. Acute systolic CHF--slowly improving, appears asymptomatic. Lasix and Milrinone stopped per cardiology secondary to azotemia. 12L negative so far,  Management per cardiology. 2. Constipation--patient complains of constipation and abdominal pain, but AXR with only mild stool burden and 4 stools charted last 24 hours. Continue colace, senna; increase Miralax to BID. Try disimpaction, enema. 3. Bilateral pleural effusions, likely secondary to CHF exacerbation. No need for thoracentesis.  4. CAD--Stable. Continue ASA.  5. Hypokalemia: Continue daily repletion.  6. Acute kidney injury, resolved. 7. LUE DVT--warfarin per pharmacy. Monitor for signs of bleeding given recent GIB and biopsy  8. Left leg ulcer--Wound care. 9. Diabetes--Stable. Continue SSI.  10. Anemia, stable. 11. GERD--Continue PPI.  Code Status: DNR/DNI. Do not reapply bipap. Continue diuresis, but if decompensates, make comfort care only.  Family Communication: discussed with daughter at bedside 8/26 Disposition Plan: Return to SNF 48 hours?  Zannie Cove, MD  [facility] Team 8 Pager [phone]. If 8PM-8AM, please contact night-coverage at [email or web address], password [facility] 03/23/2012, 2:34 PM  LOS: 11 days   Brief narrative:  85yom with history of CAD s/p MI. He has been becoming progressively more short of breath. He had a CXR in the morning ordered by the nurse practitioner at the [facility] nursing home where he stays and it showed pulmonary edema and pleural effusions. He was transported to the [facility] for further evalaution. He has had a nonproductive cough, worsening swelling of the left arm, and a new fluid blister of the left leg which burst recently. He feels like drowning when he lies back. Dyspnea without exertion.  Per history, he has had a possible DVT in the left arm  previously which was treated with coumadin. He has not been on coumadin for several weeks due to his GI bleed and because he recently underwent EGD with biopsy on 02/27/12.   Consultants:  Cardiology  Critical care Procedures:  Abd [place] 8/18  PICC line 8/18  HPI/Subjective: Feels weak , Ok ortherwise  Objective: Filed Vitals:   03/22/12 1755 03/22/12 2118 03/23/12 0632 03/23/12 0955  BP: 123/52 101/54 133/38 111/37  Pulse: 76 73 78 70  Temp:  98.9 F (37.2 C) 98.5 F (36.9 C) 98.6 F (37 C)  TempSrc:  Oral Oral Oral  Resp: 20 18 20 18   Height:      Weight:   61.8 kg (136 lb 3.9 oz)   SpO2: 99% 95% 99% 99%    Intake/Output Summary (Last 24 hours) at 03/23/12 1434 Last data filed at 03/23/12 1200  Gross per 24 hour  Intake   1817 ml  Output    951 ml  Net    866 ml   Filed Weights   03/21/12 0543 03/22/12 0543 03/23/12 5284  Weight: 66.1 kg (145 lb 11.6 oz) 62 kg (136 lb 11 oz) 61.8 kg (136 lb 3.9 oz)     Exam:   General:  Appears calm, non-toxic.  Cardiovascular: RRR, no m/r/g. No LE edema left LLE, right AKA.  Respiratory: CTA bilaterally, no w/r/r. Normal respiratory effort.  Abdomen: soft, nt, mildly distended  EXT: no edema c/c  Exam current 8/27  Data Reviewed: Basic Metabolic Panel:  Lab 03/23/12 1324 03/22/12 0519 03/21/12 0505 03/20/12 0500 03/19/12 0710 03/16/12 1629  NA 130* 137 136 133* 132* --  K 3.8 3.8 4.4 3.2* 3.4* --  CL 88* 90* 88* 84*  84* --  CO2 36* 38* 41* 40* 44* --  GLUCOSE 122* 128* 119* 118* 124* --  BUN 48* 39* 31* 28* 25* --  CREATININE 1.10 0.96 0.91 0.84 0.81 --  CALCIUM 9.7 9.9 10.1 9.4 9.7 --  MG -- -- 2.0 -- -- 1.6  PHOS -- -- -- -- -- --   CBC:  Lab 03/20/12 0500 03/19/12 1800 03/18/12 1725 03/17/12 0800  WBC 7.5 6.8 6.7 6.4  NEUTROABS -- -- -- --  HGB 9.6* 9.8* 9.7* 9.5*  HCT 30.6* 31.1* 30.3* 30.7*  MCV 73.0* 72.8* 73.5* 74.3*  PLT 225 199 183 217    Basename 03/12/12 1552 12/04/11 1053  PROBNP 23086.0*  6095.0*   CBG:  Lab 03/23/12 0613 03/22/12 2114 03/22/12 1634 03/22/12 1104 03/22/12 0606  GLUCAP 122* 173* 136* 132* 131*    No results found for this or any previous visit (from the past 240 hour(s)).  Studies: Dg Abd Portable 2v  03/22/2012  *RADIOLOGY REPORT*  Clinical Data: Abdominal pain.  Constipation.  Evaluate stool burden and possible ileus.  PORTABLE ABDOMEN - 2 VIEW  Comparison: None.  Findings: The bowel gas pattern is nonobstructive.  There does not appear be significantly increased stool throughout the colon.  The stool in the rectum is mildly prominent.  There is no free intraperitoneal air.  Diffuse vascular calcifications are noted. There is diffuse lumbar spondylosis with possible sacroiliac joint ankylosis, especially on the left.  The patient is status post right hip arthroplasty.  There is diffuse left hip joint space loss.  IMPRESSION:  Mildly increased stool in the rectum.  Otherwise no significantly increased stool burden or evidence of bowel obstruction.   Original Report Authenticated By: Gerrianne Scale, M.D.     Scheduled Meds:    . antiseptic oral rinse  15 mL Mouth Rinse BID  . aspirin EC  81 mg Oral Daily  . docusate sodium  100 mg Oral BID  . dutasteride  0.5 mg Oral Daily  . feeding supplement  237 mL Oral TID BM  . FLUoxetine  30 mg Oral Daily  . fluticasone  2 spray Each Nare Daily  . insulin aspart  0-5 Units Subcutaneous QHS  . insulin aspart  0-9 Units Subcutaneous TID WC  . iron polysaccharides  150 mg Oral Daily  . isosorbide-hydrALAZINE  1 tablet Oral BID  . lidocaine  1 patch Transdermal Daily  . loratadine  5 mg Oral Daily  . multivitamin with minerals  1 tablet Oral Daily  . olopatadine  1 drop Both Eyes BID  . pantoprazole  40 mg Oral BID AC  . polyethylene glycol  17 g Oral BID  . potassium chloride  40 mEq Oral Daily  . senna  2 tablet Oral QHS  . sodium chloride  10-40 mL Intracatheter Q12H  . sodium phosphate  1 enema Rectal  Once  . spironolactone  25 mg Oral BID  . Tamsulosin HCl  0.4 mg Oral Daily  . torsemide  60 mg Oral BID  . warfarin  2.5 mg Oral q1800  . warfarin  3 mg Oral ONCE-1800  . warfarin   Does not apply Once  . Warfarin - Pharmacist Dosing Inpatient   Does not apply q1800  . DISCONTD: polyethylene glycol  17 g Oral Daily  . DISCONTD: senna-docusate  1 tablet Oral QHS   Continuous Infusions:    . sodium chloride 20 mL/hr at 03/23/12 0224  . sodium chloride 20 mL/hr at 03/17/12  0981    Principal Problem:  *Acute exacerbation of congestive heart failure Active Problems:  DIABETES MELLITUS, TYPE II  ANXIETY  HYPERTENSION  PERIPHERAL VASCULAR DISEASE  AV block, 2nd degree  Weakness  Anemia  Pleural effusion  Respiratory failure with hypercapnia  Acute kidney injury  Infected decubitus ulcer  Protein malnutrition  DVT (deep venous thrombosis)     Zannie Cove, MD  [facility] Team 8 Pager [phone]. If 8PM-8AM, please contact night-coverage at [email or web address], password [facility] 03/23/2012, 2:34 PM  LOS: 11 days   Time spent: 20 minutes

## 2012-03-24 DIAGNOSIS — J9600 Acute respiratory failure, unspecified whether with hypoxia or hypercapnia: Secondary | ICD-10-CM

## 2012-03-24 DIAGNOSIS — I509 Heart failure, unspecified: Secondary | ICD-10-CM

## 2012-03-24 LAB — PROTIME-INR: Prothrombin Time: 23.8 seconds — ABNORMAL HIGH (ref 11.6–15.2)

## 2012-03-24 LAB — CBC
HCT: 30.5 % — ABNORMAL LOW (ref 39.0–52.0)
Hemoglobin: 9.5 g/dL — ABNORMAL LOW (ref 13.0–17.0)
MCHC: 31.1 g/dL (ref 30.0–36.0)
RBC: 4.1 MIL/uL — ABNORMAL LOW (ref 4.22–5.81)

## 2012-03-24 LAB — BASIC METABOLIC PANEL
BUN: 48 mg/dL — ABNORMAL HIGH (ref 6–23)
Chloride: 90 mEq/L — ABNORMAL LOW (ref 96–112)
GFR calc Af Amer: 77 mL/min — ABNORMAL LOW (ref >90)
Glucose, Bld: 134 mg/dL — ABNORMAL HIGH (ref 70–99)
Potassium: 3.5 mEq/L (ref 3.5–5.1)

## 2012-03-24 LAB — GLUCOSE, CAPILLARY: Glucose-Capillary: 155 mg/dL — ABNORMAL HIGH (ref 70–99)

## 2012-03-24 MED ORDER — ISOSORB DINITRATE-HYDRALAZINE 20-37.5 MG PO TABS: 1.0000 | ORAL_TABLET | Freq: Two times a day (BID) | ORAL | Status: AC

## 2012-03-24 MED ORDER — TORSEMIDE 20 MG PO TABS: 60.0000 mg | ORAL_TABLET | Freq: Two times a day (BID) | ORAL | Status: AC

## 2012-03-24 MED ORDER — POLYETHYLENE GLYCOL 3350 17 G PO PACK: 17.0000 g | PACK | Freq: Two times a day (BID) | ORAL | Status: AC

## 2012-03-24 MED ORDER — WARFARIN SODIUM 2.5 MG PO TABS: 2.5000 mg | ORAL_TABLET | Freq: Every day | ORAL | Status: AC

## 2012-03-24 MED ORDER — POTASSIUM CHLORIDE CRYS ER 20 MEQ PO TBCR: 40.0000 meq | EXTENDED_RELEASE_TABLET | Freq: Every day | ORAL | Status: AC

## 2012-03-24 MED ORDER — WARFARIN SODIUM 2.5 MG PO TABS
2.5000 mg | ORAL_TABLET | Freq: Every day | ORAL | Status: DC
  Filled 2012-03-24: qty 1

## 2012-03-24 MED ORDER — TAMSULOSIN HCL 0.4 MG PO CAPS: 0.4000 mg | ORAL_CAPSULE | Freq: Every day | ORAL | Status: AC

## 2012-03-24 MED ORDER — LORAZEPAM 0.5 MG PO TABS: 0.2500 mg | ORAL_TABLET | Freq: Two times a day (BID) | ORAL | Status: AC | PRN

## 2012-03-24 MED ORDER — WARFARIN SODIUM 3 MG PO TABS
3.0000 mg | ORAL_TABLET | Freq: Every day | ORAL | Status: DC
  Filled 2012-03-24: qty 1

## 2012-03-24 NOTE — Discharge Summary (Signed)
Physician Discharge Summary  Patient ID: Ivan [name] MRN: [id number] DOB/AGE: 1926/08/06 78 y.o.  Admit date: 03/12/2012 Discharge date: 03/24/2012  Primary Care Physician:  Oliver Barre, MD  Cardiologist: Dr.Henry Katrinka Blazing  Disposition and follow up: 1. BMET on Tuesday 9/3 please fax [phone]) to Dr.Smith's office  2. FU with Dr.Smith  03/31/12/@ 11:45 AM   Discharge Diagnoses:    Acute Respiratory failure  Acute exacerbation of congestive heart failure  DIABETES MELLITUS, TYPE II  ANXIETY  HYPERTENSION  PERIPHERAL VASCULAR DISEASE  AV block, 2nd degree  Weakness  Anemia  Pleural effusion  Respiratory failure with hypercapnia  Acute kidney injury  Infected decubitus ulcer  Protein malnutrition  DVT (deep venous thrombosis) LUE  Sacra decubitus ulcers stage 2 and 3    Medication List  As of 03/24/2012 12:51 PM   STOP taking these medications         furosemide 80 MG tablet         TAKE these medications         acetic acid-aluminum acetate 2 % otic solution   Commonly known as: DOMEBORO OTIC   Place 2 drops into both ears 2 (two) times daily as needed. For infective otitis externa      aspirin EC 81 MG tablet   Take 81 mg by mouth daily.      docusate sodium 100 MG capsule   Commonly known as: COLACE   Take 100 mg by mouth 2 (two) times daily.      dutasteride 0.5 MG capsule   Commonly known as: AVODART   Take 0.5 mg by mouth daily.      FLUoxetine 10 MG capsule   Commonly known as: PROZAC   Take 30 mg by mouth daily.      fluticasone 50 MCG/ACT nasal spray   Commonly known as: FLONASE   Place 2 sprays into the nose daily.      insulin glargine 100 UNIT/ML injection   Commonly known as: LANTUS   Inject 5 Units into the skin at bedtime.      iron polysaccharides 150 MG capsule   Commonly known as: NIFEREX   Take 150 mg by mouth daily.      isosorbide-hydrALAZINE 20-37.5 MG per tablet   Commonly known as: BIDIL   Take 1 tablet by mouth 2 (two) times  daily.      Lacri-Lube S.O.P. Oint   Apply 0.25-0.5 strips to eye at bedtime as needed. For eye irritation      lidocaine 5 %   Commonly known as: LIDODERM   Place 1 patch onto the skin daily. Remove & Discard patch within 12 hours or as directed by MD.  Apply to painful area.      loratadine 5 MG chewable tablet   Commonly known as: CLARITIN   Chew 5 mg by mouth daily.      LORazepam 0.5 MG tablet   Commonly known as: ATIVAN   Take 0.5 tablets (0.25 mg total) by mouth 2 (two) times daily as needed for anxiety. For anxiety      metFORMIN 1000 MG tablet   Commonly known as: GLUCOPHAGE   Take 1,000 mg by mouth 2 (two) times daily with a meal.      olopatadine 0.1 % ophthalmic solution   Commonly known as: PATANOL   Place 1 drop into both eyes 2 (two) times daily as needed. For dry eyes      omeprazole 40 MG capsule  Commonly known as: PRILOSEC   Take 40 mg by mouth 2 (two) times daily.      OPTIVE 0.5-0.9 % Soln   Generic drug: Carboxymethylcellul-Glycerin   Apply 1-2 drops to eye 2 (two) times daily as needed. For dry eyes      polyethylene glycol packet   Commonly known as: MIRALAX / GLYCOLAX   Take 17 g by mouth 2 (two) times daily.      potassium chloride SA 20 MEQ tablet   Commonly known as: K-DUR,KLOR-CON   Take 2 tablets (40 mEq total) by mouth daily.      rosuvastatin 20 MG tablet   Commonly known as: CRESTOR   Take 20 mg by mouth every evening.      senna-docusate 8.6-50 MG per tablet   Commonly known as: Senokot-S   Take 1 tablet by mouth at bedtime.      spironolactone 25 MG tablet   Commonly known as: ALDACTONE   Take 25 mg by mouth 2 (two) times daily.      Tamsulosin HCl 0.4 MG Caps   Commonly known as: FLOMAX   Take 1 capsule (0.4 mg total) by mouth daily.      torsemide 20 MG tablet   Commonly known as: DEMADEX   Take 3 tablets (60 mg total) by mouth 2 (two) times daily.      TRILIPIX 135 MG capsule   Generic drug: Choline Fenofibrate    Take 135 mg by mouth daily.      Vitamin D (Ergocalciferol) 50000 UNITS Caps   Commonly known as: DRISDOL   Take 50,000 Units by mouth every 30 (thirty) days.      warfarin 2.5 MG tablet   Commonly known as: COUMADIN   Take 1 tablet (2.5 mg total) by mouth daily at 6 PM. Please check INR in 2 days, for INR 2-3      zolpidem 5 MG tablet   Commonly known as: AMBIEN   Take 5 mg by mouth at bedtime as needed. For sleep            Consults: Dr.Smith   Significant Diagnostic Studies:  Dg Abd Portable 2v  03/22/2012  *RADIOLOGY REPORT*  Clinical Data: Abdominal pain.  Constipation.  Evaluate stool burden and possible ileus.  PORTABLE ABDOMEN - 2 VIEW  Comparison: None.  Findings: The bowel gas pattern is nonobstructive.  There does not appear be significantly increased stool throughout the colon.  The stool in the rectum is mildly prominent.  There is no free intraperitoneal air.  Diffuse vascular calcifications are noted. There is diffuse lumbar spondylosis with possible sacroiliac joint ankylosis, especially on the left.  The patient is status post right hip arthroplasty.  There is diffuse left hip joint space loss.  IMPRESSION:  Mildly increased stool in the rectum.  Otherwise no significantly increased stool burden or evidence of bowel obstruction.   Original Report Authenticated By: Gerrianne Scale, M.D.     Brief H and P: The patient is an 78 year old male with history of CAD s/p MI without CABG or stent. He had worsening CHF with 2nd degree 2:1 heart block requiring pacemaker placement 12/03/2011. He also has diabetes, PVD s/p right AKA, left calf ulcer.  The patient was at his baseline with 2L home oxygen about 5 days ago. He has been becoming progressively more short of breath. Last night, he was lifted via [place] lift and when he was placed back in bed, he felt very short of breath  and may have had an episode of syncope. He turned cyanotic and they turned up his oxygen. He had a CXR in  the morning ordered by the nurse practitioner at the [facility] nursing home where he stays and it showed pulmonary edema and pleural effusions. He was transported to the [facility] for further evalaution. He has had a nonproductive cough, worsening swelling of the left arm, and a new fluid blister of the left leg which burst recently. He feels like drowning when he lies back. Dyspnea without exertion.  Per history, he has had a possible DVT in the left arm previously which was treated with coumadin. He has not been on coumadin for several weeks due to his GI bleed and because he recently underwent EGD with biopsy on 02/27/12  Hospital Course:  1. Acute systolic CHF-with bilateral pleural effusions and hypercapneic respiratory failure- Initially treated with Milrinone and dobutamine and BIPAP Was followed by [facility] cardiology throughout hospitalization  Clinically improved very slowly with diuresis Finally about 12.9L net negative from admission Then transitioned to PO Torsemide per Dr.Smith Will need Bmet checked on Tuesday 9/3 and results faxed to Dr.Smith And close Fu in 1 week  2. Constipation--patient complains of constipation and abdominal pain, but AXR with only mild stool burden . Continue colace, senna; increase Miralax to BID. Having BMs  Daily on this regimen  3. CAD--Stable. Continue ASA, did not pursue aggressive workup due to poor performance status and co-morbidities  4. Acute kidney injury, resolved.  5. LUE DVT--warfarin per pharmacy. INR therapeutic, please re-check this in 2 days  6. Sacral decubitus ulcers stage 2 and stage 3: continue local wound care  7. Diabetes--Stable. Continue SSI.    Code Status: DNR/DNI. Do not reapply bipap. Continue medical management, but if decompensates, make comfort care only.     Time spent on Discharge:  Signed: Dehaven Sine [facility]  03/24/2012, 12:51 PM

## 2012-03-24 NOTE — Progress Notes (Signed)
Patient Name: Ivan [name] Date of Encounter: 03/24/2012    SUBJECTIVE: Just spoke to Dr. Jomarie Longs who may send patient home today. He has had > 20 lb diuresis since admission. He voices no complaint of dyspnea.  TELEMETRY:  Paced: Filed Vitals:   03/23/12 1430 03/23/12 1522 03/23/12 2247 03/24/12 0525  BP: 116/40 102/54 127/62 120/44  Pulse: 74 79 79 72  Temp: 98.5 F (36.9 C) 98.5 F (36.9 C) 98.4 F (36.9 C) 97.4 F (36.3 C)  TempSrc: Oral Oral Oral Axillary  Resp: 18 20 20 20   Height:      Weight:    62.9 kg (138 lb 10.7 oz)  SpO2: 93% 94% 94% 100%    Intake/Output Summary (Last 24 hours) at 03/24/12 1211 Last data filed at 03/24/12 1145  Gross per 24 hour  Intake    600 ml  Output   1352 ml  Net   -752 ml   NET since discharge: 12654 cc  LABS: Basic Metabolic Panel:  Basename 03/24/12 0633 03/23/12 0510  NA 132* 130*  K 3.5 3.8  CL 90* 88*  CO2 35* 36*  GLUCOSE 134* 122*  BUN 48* 48*  CREATININE 1.01 1.10  CALCIUM 9.6 9.7  MG -- --  PHOS -- --   CBC:  Basename 03/24/12 0633  WBC 7.0  NEUTROABS --  HGB 9.5*  HCT 30.5*  MCV 74.4*  PLT 284   Radiology/Studies:   No new.  Physical Exam: Blood pressure 120/44, pulse 72, temperature 97.4 F (36.3 C), temperature source Axillary, resp. rate 20, height 5\' 8"  (1.727 m), weight 62.9 kg (138 lb 10.7 oz), SpO2 100.00%. Weight change: 1.1 kg (2 lb 6.8 oz)   Decreased breath sounds.  ASSESSMENT:  1. Combined systolic and diastolic heart failure, acute on chronic with anasarca now resolved after prolonged in hospital aggressive diuresis.  Plan:  1. Continue Demadex at 60 mg BID and Spironolactone 25 mg BID.  2. BMET 2-4 days after discharge and have faxed [phone]) to my office  3. OV with me 03/31/12/@ 11:45 A  Signed, Lesleigh Noe 03/24/2012, 12:11 PM

## 2012-03-24 NOTE — Progress Notes (Addendum)
ANTICOAGULATION CONSULT NOTE - Follow Up Consult  Pharmacy Consult: Coumadin Indication: DVT  Allergies  Allergen Reactions  . Sulfa Antibiotics Rash  . Atorvastatin   . Penicillins   . Simvastatin     Patient Measurements: Height: 5\' 8"  (172.7 cm) Weight: 138 lb 10.7 oz (62.9 kg) (BEDSCALE) IBW/kg (Calculated) : 68.4   Vital Signs: Temp: 97.4 F (36.3 C) (08/29 0525) Temp src: Axillary (08/29 0525) BP: 120/44 mmHg (08/29 0525) Pulse Rate: 72  (08/29 0525)  Labs:  Ivan [name] 03/24/12 1610 03/23/12 0510 03/22/12 0519  HGB 9.5* -- --  HCT 30.5* -- --  PLT 284 -- --  APTT -- -- --  LABPROT 23.8* 25.7* 21.6*  INR 2.09* 2.30* 1.84*  HEPARINUNFRC -- -- --  CREATININE 1.01 1.10 0.96  CKTOTAL -- -- --  CKMB -- -- --  TROPONINI -- -- --    Estimated Creatinine Clearance: 49.3 ml/min (by C-G formula based on Cr of 1.01).    Assessment: 50 YOM admitted 03/12/2012 for increased SOB and found to have LUE VTE. Pharmacy consulted to manage Coumadin for new LUE DVT. He has had a history of UE DVT before and was taken off of Coumadin for several weeks due to a recent GIB.  INR therapeutic today, yet decreased from yesterday.  No complications noted.  Noted possible d/c to NH today.  Discussed plan with Dr Jomarie Longs to continue current coumadin 2.5 mg po qday for now and check INR as outpatient in 2 days.  Goal of Therapy:  INR 2-3   Plan:  - Continue Coumadin 2.5 mg PO daily - Daily PT / INR - Consider resuming home med:  Crestor  Finnleigh Marchetti L. Illene Bolus, PharmD, BCPS Clinical Pharmacist Pager: [phone] Pharmacy: [phone] 03/24/2012 1:10 PM

## 2012-03-24 NOTE — Progress Notes (Signed)
Clinical social worker assisted with patient discharge to skilled nursing facility, [facility].  CSW addressed all family questions and concerns. CSW copied chart and added all important documents. CSW also set up patient transportation with [facility]. Clinical Social Worker will sign off for now as social work intervention is no longer needed.   Linnea Todisco, MSW, LCSWA [phone] 

## 2012-04-26 DEATH — deceased

## 2014-03-28 ENCOUNTER — Encounter: Payer: Self-pay | Admitting: *Deleted

## 2014-04-24 ENCOUNTER — Telehealth: Payer: Self-pay | Admitting: Internal Medicine

## 2014-04-24 ENCOUNTER — Encounter: Payer: Self-pay | Admitting: Internal Medicine

## 2014-04-24 NOTE — Telephone Encounter (Signed)
02-01-01 PAST DUE CERTIFIED LETTER SENT/MT

## 2014-05-08 NOTE — Telephone Encounter (Signed)
[id number] CERT LETTER RETURNED UNCLAIMED/MT

## 2014-07-05 ENCOUNTER — Encounter (HOSPITAL_COMMUNITY): Payer: Self-pay | Admitting: Internal Medicine
